# Patient Record
Sex: Male | Born: 1950
Health system: Southern US, Community
[De-identification: ages and names within clinical notes are randomized; demographics above are authoritative.]

## PROBLEM LIST (undated history)

## (undated) DIAGNOSIS — F32A Depression, unspecified: Secondary | ICD-10-CM

## (undated) DIAGNOSIS — E785 Hyperlipidemia, unspecified: Secondary | ICD-10-CM

## (undated) DIAGNOSIS — F1020 Alcohol dependence, uncomplicated: Secondary | ICD-10-CM

## (undated) DIAGNOSIS — I10 Essential (primary) hypertension: Secondary | ICD-10-CM

## (undated) DIAGNOSIS — E119 Type 2 diabetes mellitus without complications: Secondary | ICD-10-CM

## (undated) HISTORY — DX: Type 2 diabetes mellitus without complications: E11.9

## (undated) HISTORY — DX: Alcohol dependence, uncomplicated: F10.20

## (undated) HISTORY — DX: Depression, unspecified: F32.A

## (undated) HISTORY — DX: Hyperlipidemia, unspecified: E78.5

## (undated) HISTORY — PX: HERNIA REPAIR: SHX51

## (undated) HISTORY — DX: Essential (primary) hypertension: I10

## (undated) HISTORY — PX: TONSILLECTOMY: SUR1361

---

## 2010-10-22 LAB — HM COLONOSCOPY

## 2016-08-12 ENCOUNTER — Ambulatory Visit (INDEPENDENT_AMBULATORY_CARE_PROVIDER_SITE_OTHER): Payer: Self-pay | Admitting: Nurse Practitioner

## 2016-08-12 VITALS — BP 154/88 | HR 90 | Temp 98.5°F | Wt 187.2 lb

## 2016-08-12 DIAGNOSIS — K5792 Diverticulitis of intestine, part unspecified, without perforation or abscess without bleeding: Secondary | ICD-10-CM

## 2016-08-12 MED ORDER — CIPROFLOXACIN HCL 500 MG PO TABS: 500.0000 mg | ORAL_TABLET | Freq: Two times a day (BID) | ORAL | 0 refills | Status: DC

## 2016-08-12 MED ORDER — METRONIDAZOLE 500 MG PO TABS: 500.0000 mg | ORAL_TABLET | Freq: Three times a day (TID) | ORAL | 0 refills | Status: DC

## 2016-08-12 NOTE — Progress Notes (Signed)
Subjective:     Robert Walker is a 66 y.o. male who presents for evaluation of abdominal pain. Onset was today a few hours ago. Symptoms have been gradually worsening. The pain is described as sharp, and is 6/10 in intensity. Pain is located in the LLQ without radiation.  Aggravating factors: stress and diet.  Alleviating factors: antibiotics. Associated symptoms: fatigue and mild nausea with diarrhea. The patient denies anorexia, belching, constipation.  Patient has a history of diverticulitis.  Patient is visiting from out of town and forgot his Cipro and Flagyl that he normally takes.  The patient's history has been marked as reviewed and updated as appropriate. No past medical history on file. Current Outpatient Prescriptions  Medication Sig Dispense Refill  . citalopram (CELEXA) 10 MG tablet Take 10 mg by mouth daily.    Marland Kitchen lisinopril-hydrochlorothiazide (PRINZIDE,ZESTORETIC) 20-12.5 MG tablet Take 1 tablet by mouth daily.    . simvastatin (ZOCOR) 10 MG tablet Take 10 mg by mouth daily.    Marland Kitchen zolpidem (AMBIEN) 10 MG tablet Take 10 mg by mouth at bedtime as needed for sleep.     No current facility-administered medications for this visit.    Allergies: Patient has no known allergies.  Review of Systems Constitutional: positive for fatigue Eyes: negative Ears, nose, mouth, throat, and face: negative Respiratory: negative Cardiovascular: negative Gastrointestinal: positive for abdominal pain, diarrhea and nausea, negative for dysphagia and reflux symptoms Neurological: negative     Objective:    BP (!) 154/88 (BP Location: Right Arm, Patient Position: Sitting, Cuff Size: Normal)   Pulse 90   Temp 98.5 F (36.9 C) (Oral)   Wt 187 lb 3.2 oz (84.9 kg)  General appearance: alert and cooperative Head: Normocephalic, without obvious abnormality, atraumatic Eyes: conjunctivae/corneas clear. PERRL, EOM's intact. Fundi benign. Ears: normal TM's and external ear canals both ears Nose: Nares  normal. Septum midline. Mucosa normal. No drainage or sinus tenderness. Throat: lips, mucosa, and tongue normal; teeth and gums normal Lungs: clear to auscultation bilaterally Heart: regular rate and rhythm, S1, S2 normal, no murmur, click, rub or gallop Abdomen: abnormal findings:  LLQ tenderness Neurologic: Grossly normal    Assessment:    Abdominal pain, likely secondary to Diverticulitis Exacerbation .    Plan:    The diagnosis was discussed with the patient and evaluation and treatment plans outlined. Follow up with PCP in 5-7  days. Liquid diet until symptoms improve.  Take antibiotics as ordered.  Go to ER if increasing abdominal pain, fever, intractable vomiting, or other concerns.  Patient verbalized understanding.

## 2016-08-12 NOTE — Patient Instructions (Addendum)

## 2016-08-14 ENCOUNTER — Telehealth: Payer: Self-pay | Admitting: Nurse Practitioner

## 2016-08-14 NOTE — Telephone Encounter (Signed)
Called patient to check his status.  Reached voicemail, left message for patient to return the call.

## 2018-05-11 DIAGNOSIS — K5792 Diverticulitis of intestine, part unspecified, without perforation or abscess without bleeding: Secondary | ICD-10-CM | POA: Diagnosis not present

## 2018-05-11 DIAGNOSIS — R109 Unspecified abdominal pain: Secondary | ICD-10-CM | POA: Diagnosis not present

## 2018-05-25 DIAGNOSIS — Z0000 Encounter for general adult medical examination without abnormal findings: Secondary | ICD-10-CM | POA: Diagnosis not present

## 2018-09-02 ENCOUNTER — Other Ambulatory Visit: Payer: Self-pay

## 2018-11-07 DIAGNOSIS — K5792 Diverticulitis of intestine, part unspecified, without perforation or abscess without bleeding: Secondary | ICD-10-CM | POA: Diagnosis not present

## 2018-11-07 DIAGNOSIS — Z23 Encounter for immunization: Secondary | ICD-10-CM | POA: Diagnosis not present

## 2018-12-13 DIAGNOSIS — I10 Essential (primary) hypertension: Secondary | ICD-10-CM | POA: Diagnosis not present

## 2018-12-13 DIAGNOSIS — Z1329 Encounter for screening for other suspected endocrine disorder: Secondary | ICD-10-CM | POA: Diagnosis not present

## 2018-12-13 DIAGNOSIS — E1165 Type 2 diabetes mellitus with hyperglycemia: Secondary | ICD-10-CM | POA: Diagnosis not present

## 2018-12-13 DIAGNOSIS — E785 Hyperlipidemia, unspecified: Secondary | ICD-10-CM | POA: Diagnosis not present

## 2018-12-15 DIAGNOSIS — I10 Essential (primary) hypertension: Secondary | ICD-10-CM | POA: Diagnosis not present

## 2018-12-15 DIAGNOSIS — F3340 Major depressive disorder, recurrent, in remission, unspecified: Secondary | ICD-10-CM | POA: Diagnosis not present

## 2018-12-15 DIAGNOSIS — E782 Mixed hyperlipidemia: Secondary | ICD-10-CM | POA: Diagnosis not present

## 2018-12-15 DIAGNOSIS — Z8719 Personal history of other diseases of the digestive system: Secondary | ICD-10-CM | POA: Diagnosis not present

## 2018-12-15 DIAGNOSIS — R7301 Impaired fasting glucose: Secondary | ICD-10-CM | POA: Diagnosis not present

## 2018-12-15 DIAGNOSIS — R7303 Prediabetes: Secondary | ICD-10-CM | POA: Diagnosis not present

## 2019-03-02 DIAGNOSIS — D2112 Benign neoplasm of connective and other soft tissue of left upper limb, including shoulder: Secondary | ICD-10-CM | POA: Diagnosis not present

## 2019-03-02 DIAGNOSIS — L308 Other specified dermatitis: Secondary | ICD-10-CM | POA: Diagnosis not present

## 2019-03-02 DIAGNOSIS — I788 Other diseases of capillaries: Secondary | ICD-10-CM | POA: Diagnosis not present

## 2019-03-02 DIAGNOSIS — D2362 Other benign neoplasm of skin of left upper limb, including shoulder: Secondary | ICD-10-CM | POA: Diagnosis not present

## 2019-03-02 DIAGNOSIS — D485 Neoplasm of uncertain behavior of skin: Secondary | ICD-10-CM | POA: Diagnosis not present

## 2019-03-02 DIAGNOSIS — D1801 Hemangioma of skin and subcutaneous tissue: Secondary | ICD-10-CM | POA: Diagnosis not present

## 2019-08-30 ENCOUNTER — Other Ambulatory Visit: Payer: Self-pay

## 2019-11-09 DIAGNOSIS — Z8719 Personal history of other diseases of the digestive system: Secondary | ICD-10-CM | POA: Diagnosis not present

## 2019-11-09 DIAGNOSIS — I10 Essential (primary) hypertension: Secondary | ICD-10-CM | POA: Diagnosis not present

## 2019-11-09 DIAGNOSIS — E785 Hyperlipidemia, unspecified: Secondary | ICD-10-CM | POA: Diagnosis not present

## 2019-11-09 DIAGNOSIS — R7303 Prediabetes: Secondary | ICD-10-CM | POA: Diagnosis not present

## 2019-11-09 DIAGNOSIS — R7301 Impaired fasting glucose: Secondary | ICD-10-CM | POA: Diagnosis not present

## 2019-11-09 DIAGNOSIS — Z712 Person consulting for explanation of examination or test findings: Secondary | ICD-10-CM | POA: Diagnosis not present

## 2019-11-09 DIAGNOSIS — Z0000 Encounter for general adult medical examination without abnormal findings: Secondary | ICD-10-CM | POA: Diagnosis not present

## 2019-11-09 DIAGNOSIS — K5792 Diverticulitis of intestine, part unspecified, without perforation or abscess without bleeding: Secondary | ICD-10-CM | POA: Diagnosis not present

## 2019-11-09 DIAGNOSIS — F3340 Major depressive disorder, recurrent, in remission, unspecified: Secondary | ICD-10-CM | POA: Diagnosis not present

## 2019-11-09 DIAGNOSIS — R109 Unspecified abdominal pain: Secondary | ICD-10-CM | POA: Diagnosis not present

## 2019-11-09 DIAGNOSIS — E782 Mixed hyperlipidemia: Secondary | ICD-10-CM | POA: Diagnosis not present

## 2019-11-09 DIAGNOSIS — R1013 Epigastric pain: Secondary | ICD-10-CM | POA: Diagnosis not present

## 2019-11-14 DIAGNOSIS — E782 Mixed hyperlipidemia: Secondary | ICD-10-CM | POA: Diagnosis not present

## 2019-11-14 DIAGNOSIS — I10 Essential (primary) hypertension: Secondary | ICD-10-CM | POA: Diagnosis not present

## 2019-11-14 DIAGNOSIS — F3340 Major depressive disorder, recurrent, in remission, unspecified: Secondary | ICD-10-CM | POA: Diagnosis not present

## 2019-11-14 DIAGNOSIS — Z0001 Encounter for general adult medical examination with abnormal findings: Secondary | ICD-10-CM | POA: Diagnosis not present

## 2019-11-14 DIAGNOSIS — Z8719 Personal history of other diseases of the digestive system: Secondary | ICD-10-CM | POA: Diagnosis not present

## 2019-11-14 DIAGNOSIS — R7303 Prediabetes: Secondary | ICD-10-CM | POA: Diagnosis not present

## 2019-11-14 DIAGNOSIS — R7301 Impaired fasting glucose: Secondary | ICD-10-CM | POA: Diagnosis not present

## 2019-11-14 DIAGNOSIS — Z23 Encounter for immunization: Secondary | ICD-10-CM | POA: Diagnosis not present

## 2019-12-28 DIAGNOSIS — E7849 Other hyperlipidemia: Secondary | ICD-10-CM | POA: Diagnosis not present

## 2019-12-28 DIAGNOSIS — I10 Essential (primary) hypertension: Secondary | ICD-10-CM | POA: Diagnosis not present

## 2020-02-03 DIAGNOSIS — I10 Essential (primary) hypertension: Secondary | ICD-10-CM | POA: Diagnosis not present

## 2020-02-03 DIAGNOSIS — E7849 Other hyperlipidemia: Secondary | ICD-10-CM | POA: Diagnosis not present

## 2020-04-02 ENCOUNTER — Other Ambulatory Visit (HOSPITAL_COMMUNITY): Payer: Self-pay | Admitting: Family Medicine

## 2020-04-02 DIAGNOSIS — R053 Chronic cough: Secondary | ICD-10-CM

## 2020-04-02 DIAGNOSIS — J069 Acute upper respiratory infection, unspecified: Secondary | ICD-10-CM | POA: Diagnosis not present

## 2020-04-02 DIAGNOSIS — R059 Cough, unspecified: Secondary | ICD-10-CM | POA: Diagnosis not present

## 2020-04-03 ENCOUNTER — Other Ambulatory Visit: Payer: Self-pay

## 2020-04-03 ENCOUNTER — Ambulatory Visit (HOSPITAL_COMMUNITY)
Admission: RE | Admit: 2020-04-03 | Discharge: 2020-04-03 | Disposition: A | Discharge status: Home / Self Care | Payer: PPO | Source: Ambulatory Visit | Attending: Family Medicine | Admitting: Family Medicine

## 2020-04-03 DIAGNOSIS — R059 Cough, unspecified: Secondary | ICD-10-CM | POA: Diagnosis not present

## 2020-04-03 DIAGNOSIS — R053 Chronic cough: Secondary | ICD-10-CM | POA: Diagnosis not present

## 2020-05-02 DIAGNOSIS — I10 Essential (primary) hypertension: Secondary | ICD-10-CM | POA: Diagnosis not present

## 2020-05-02 DIAGNOSIS — E1165 Type 2 diabetes mellitus with hyperglycemia: Secondary | ICD-10-CM | POA: Diagnosis not present

## 2020-06-03 DIAGNOSIS — E1165 Type 2 diabetes mellitus with hyperglycemia: Secondary | ICD-10-CM | POA: Diagnosis not present

## 2020-06-03 DIAGNOSIS — I10 Essential (primary) hypertension: Secondary | ICD-10-CM | POA: Diagnosis not present

## 2020-10-03 DIAGNOSIS — I10 Essential (primary) hypertension: Secondary | ICD-10-CM | POA: Diagnosis not present

## 2020-10-03 DIAGNOSIS — E1165 Type 2 diabetes mellitus with hyperglycemia: Secondary | ICD-10-CM | POA: Diagnosis not present

## 2020-11-07 DIAGNOSIS — E119 Type 2 diabetes mellitus without complications: Secondary | ICD-10-CM | POA: Diagnosis not present

## 2020-11-07 DIAGNOSIS — I10 Essential (primary) hypertension: Secondary | ICD-10-CM | POA: Diagnosis not present

## 2020-11-14 DIAGNOSIS — E1165 Type 2 diabetes mellitus with hyperglycemia: Secondary | ICD-10-CM | POA: Diagnosis not present

## 2020-11-14 DIAGNOSIS — Z125 Encounter for screening for malignant neoplasm of prostate: Secondary | ICD-10-CM | POA: Diagnosis not present

## 2020-11-14 DIAGNOSIS — Z23 Encounter for immunization: Secondary | ICD-10-CM | POA: Diagnosis not present

## 2020-11-14 DIAGNOSIS — F3340 Major depressive disorder, recurrent, in remission, unspecified: Secondary | ICD-10-CM | POA: Diagnosis not present

## 2020-11-14 DIAGNOSIS — Z8719 Personal history of other diseases of the digestive system: Secondary | ICD-10-CM | POA: Diagnosis not present

## 2020-11-14 DIAGNOSIS — R7401 Elevation of levels of liver transaminase levels: Secondary | ICD-10-CM | POA: Diagnosis not present

## 2020-11-14 DIAGNOSIS — I10 Essential (primary) hypertension: Secondary | ICD-10-CM | POA: Diagnosis not present

## 2020-11-14 DIAGNOSIS — F1021 Alcohol dependence, in remission: Secondary | ICD-10-CM | POA: Diagnosis not present

## 2020-11-14 DIAGNOSIS — E782 Mixed hyperlipidemia: Secondary | ICD-10-CM | POA: Diagnosis not present

## 2021-02-14 DIAGNOSIS — E1165 Type 2 diabetes mellitus with hyperglycemia: Secondary | ICD-10-CM | POA: Diagnosis not present

## 2021-02-14 DIAGNOSIS — E782 Mixed hyperlipidemia: Secondary | ICD-10-CM | POA: Diagnosis not present

## 2021-02-14 DIAGNOSIS — Z125 Encounter for screening for malignant neoplasm of prostate: Secondary | ICD-10-CM | POA: Diagnosis not present

## 2021-02-22 DIAGNOSIS — K029 Dental caries, unspecified: Secondary | ICD-10-CM | POA: Diagnosis not present

## 2021-02-22 DIAGNOSIS — Z125 Encounter for screening for malignant neoplasm of prostate: Secondary | ICD-10-CM | POA: Diagnosis not present

## 2021-02-22 DIAGNOSIS — F3340 Major depressive disorder, recurrent, in remission, unspecified: Secondary | ICD-10-CM | POA: Diagnosis not present

## 2021-02-22 DIAGNOSIS — I10 Essential (primary) hypertension: Secondary | ICD-10-CM | POA: Diagnosis not present

## 2021-02-22 DIAGNOSIS — Z8719 Personal history of other diseases of the digestive system: Secondary | ICD-10-CM | POA: Diagnosis not present

## 2021-02-22 DIAGNOSIS — E782 Mixed hyperlipidemia: Secondary | ICD-10-CM | POA: Diagnosis not present

## 2021-02-22 DIAGNOSIS — E1165 Type 2 diabetes mellitus with hyperglycemia: Secondary | ICD-10-CM | POA: Diagnosis not present

## 2021-02-22 DIAGNOSIS — F1021 Alcohol dependence, in remission: Secondary | ICD-10-CM | POA: Diagnosis not present

## 2021-02-22 DIAGNOSIS — R7401 Elevation of levels of liver transaminase levels: Secondary | ICD-10-CM | POA: Diagnosis not present

## 2021-04-24 ENCOUNTER — Other Ambulatory Visit (HOSPITAL_COMMUNITY): Payer: Self-pay | Admitting: Nurse Practitioner

## 2021-04-24 DIAGNOSIS — M25541 Pain in joints of right hand: Secondary | ICD-10-CM | POA: Diagnosis not present

## 2021-04-24 DIAGNOSIS — M25542 Pain in joints of left hand: Secondary | ICD-10-CM | POA: Diagnosis not present

## 2021-04-24 DIAGNOSIS — M79642 Pain in left hand: Secondary | ICD-10-CM

## 2021-04-24 DIAGNOSIS — M25531 Pain in right wrist: Secondary | ICD-10-CM | POA: Diagnosis not present

## 2021-04-24 DIAGNOSIS — M25532 Pain in left wrist: Secondary | ICD-10-CM

## 2021-04-24 DIAGNOSIS — M79641 Pain in right hand: Secondary | ICD-10-CM

## 2021-04-25 ENCOUNTER — Other Ambulatory Visit: Payer: Self-pay

## 2021-04-25 ENCOUNTER — Ambulatory Visit (HOSPITAL_COMMUNITY)
Admission: RE | Admit: 2021-04-25 | Discharge: 2021-04-25 | Disposition: A | Discharge status: Home / Self Care | Payer: PPO | Source: Ambulatory Visit | Attending: Nurse Practitioner | Admitting: Nurse Practitioner

## 2021-04-25 DIAGNOSIS — M25532 Pain in left wrist: Secondary | ICD-10-CM | POA: Insufficient documentation

## 2021-04-25 DIAGNOSIS — M19032 Primary osteoarthritis, left wrist: Secondary | ICD-10-CM | POA: Diagnosis not present

## 2021-04-25 DIAGNOSIS — M19042 Primary osteoarthritis, left hand: Secondary | ICD-10-CM | POA: Diagnosis not present

## 2021-04-25 DIAGNOSIS — M19041 Primary osteoarthritis, right hand: Secondary | ICD-10-CM | POA: Diagnosis not present

## 2021-04-25 DIAGNOSIS — M79642 Pain in left hand: Secondary | ICD-10-CM | POA: Diagnosis not present

## 2021-04-25 DIAGNOSIS — M79641 Pain in right hand: Secondary | ICD-10-CM | POA: Insufficient documentation

## 2021-04-25 DIAGNOSIS — M25541 Pain in joints of right hand: Secondary | ICD-10-CM | POA: Diagnosis not present

## 2021-04-25 DIAGNOSIS — M19031 Primary osteoarthritis, right wrist: Secondary | ICD-10-CM | POA: Diagnosis not present

## 2021-05-01 ENCOUNTER — Other Ambulatory Visit: Payer: Self-pay | Admitting: Nurse Practitioner

## 2021-05-01 ENCOUNTER — Other Ambulatory Visit (HOSPITAL_COMMUNITY): Payer: Self-pay | Admitting: Nurse Practitioner

## 2021-05-01 DIAGNOSIS — R2231 Localized swelling, mass and lump, right upper limb: Secondary | ICD-10-CM

## 2021-05-01 DIAGNOSIS — M25531 Pain in right wrist: Secondary | ICD-10-CM | POA: Diagnosis not present

## 2021-05-01 DIAGNOSIS — M25542 Pain in joints of left hand: Secondary | ICD-10-CM | POA: Diagnosis not present

## 2021-05-01 DIAGNOSIS — M25541 Pain in joints of right hand: Secondary | ICD-10-CM | POA: Diagnosis not present

## 2021-05-06 ENCOUNTER — Ambulatory Visit: Payer: PPO | Admitting: Family Medicine

## 2021-05-07 ENCOUNTER — Ambulatory Visit (HOSPITAL_BASED_OUTPATIENT_CLINIC_OR_DEPARTMENT_OTHER)
Admission: RE | Admit: 2021-05-07 | Discharge: 2021-05-07 | Disposition: A | Discharge status: Home / Self Care | Payer: PPO | Source: Ambulatory Visit | Attending: Nurse Practitioner | Admitting: Nurse Practitioner

## 2021-05-07 DIAGNOSIS — R2231 Localized swelling, mass and lump, right upper limb: Secondary | ICD-10-CM | POA: Diagnosis not present

## 2021-05-07 DIAGNOSIS — M1811 Unilateral primary osteoarthritis of first carpometacarpal joint, right hand: Secondary | ICD-10-CM | POA: Diagnosis not present

## 2021-05-23 ENCOUNTER — Ambulatory Visit (INDEPENDENT_AMBULATORY_CARE_PROVIDER_SITE_OTHER): Payer: PPO | Admitting: Family Medicine

## 2021-05-23 ENCOUNTER — Telehealth: Payer: Self-pay | Admitting: Family Medicine

## 2021-05-23 ENCOUNTER — Encounter (INDEPENDENT_AMBULATORY_CARE_PROVIDER_SITE_OTHER): Payer: Self-pay | Admitting: *Deleted

## 2021-05-23 ENCOUNTER — Encounter: Payer: Self-pay | Admitting: Family Medicine

## 2021-05-23 VITALS — BP 134/84 | HR 81 | Ht 68.0 in | Wt 182.0 lb

## 2021-05-23 DIAGNOSIS — Z23 Encounter for immunization: Secondary | ICD-10-CM | POA: Diagnosis not present

## 2021-05-23 DIAGNOSIS — M19041 Primary osteoarthritis, right hand: Secondary | ICD-10-CM

## 2021-05-23 DIAGNOSIS — M67431 Ganglion, right wrist: Secondary | ICD-10-CM | POA: Diagnosis not present

## 2021-05-23 DIAGNOSIS — E559 Vitamin D deficiency, unspecified: Secondary | ICD-10-CM

## 2021-05-23 DIAGNOSIS — K5792 Diverticulitis of intestine, part unspecified, without perforation or abscess without bleeding: Secondary | ICD-10-CM

## 2021-05-23 DIAGNOSIS — R7301 Impaired fasting glucose: Secondary | ICD-10-CM

## 2021-05-23 DIAGNOSIS — Z1329 Encounter for screening for other suspected endocrine disorder: Secondary | ICD-10-CM | POA: Diagnosis not present

## 2021-05-23 DIAGNOSIS — Z1159 Encounter for screening for other viral diseases: Secondary | ICD-10-CM | POA: Diagnosis not present

## 2021-05-23 DIAGNOSIS — E7800 Pure hypercholesterolemia, unspecified: Secondary | ICD-10-CM | POA: Diagnosis not present

## 2021-05-23 DIAGNOSIS — Z131 Encounter for screening for diabetes mellitus: Secondary | ICD-10-CM | POA: Diagnosis not present

## 2021-05-23 NOTE — Progress Notes (Addendum)
? ?New Patient Office Visit ? ?Subjective:  ?Patient ID: Robert Walker, male    DOB: [birth year]  Age: 71 y.o. MRN: [id number] ? ?CC:  ?Chief Complaint  ?Patient presents with  ? New Patient (Initial Visit)  ?  Pt complains of arthritis on right hand, and has some discomfort on left hand also.  ? ? ?HPI ?RICKARD KENNERLY is a 71 y.o. male with a PMH of diverticulitis presents for establishing care. The patient is seen today alongside his wife. 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 Recently, the pain in his right hand has intensified. The patient reports using OTC Voltaren gel with minimum relief. He also noted a cyst at the base of the thumb that hurt internally and not with palpation.  ?The patient has had diverticulitis for 32 years and reports that OTC medication like Motrin and Tylenol tablets triggers his diverticulitis. He can only tolerate Motrin and Tylenol if given in liquid form. ? ?Received first Shingrix vaccine today. ? ?History reviewed. No pertinent past medical history. ? ?History reviewed. No pertinent surgical history. ? ?History reviewed. No pertinent family history. ? ?Social History  ? ?Socioeconomic History  ? Marital status: Married  ?  Spouse name: Not on file  ? Number of children: Not on file  ? Years of education: Not on file  ? Highest education level: Not on file  ?Occupational History  ? Not on file  ?Tobacco Use  ? Smoking status: Never  ?  Passive exposure: Never  ? Smokeless tobacco: Never  ?Substance and Sexual Activity  ? Alcohol use: Not on file  ? Drug use: Not on file  ? Sexual activity: Not on file  ?Other Topics Concern  ? Not on file  ?Social History Narrative  ? Not on file  ? ?Social Determinants of Health  ? ?Financial Resource Strain: Not on file  ?Food Insecurity: Not on file  ?Transportation Needs: Not on file  ?Physical Activity: Not on file  ?Stress: Not on  file  ?Social Connections: Not on file  ?Intimate Partner Violence: Not on file  ? ? ?ROS ?Review of Systems  ?Constitutional:  Negative for chills, fatigue and fever.  ?HENT:  Negative for rhinorrhea, sinus pressure, sinus pain and sore throat.   ?Eyes:  Negative for pain, redness and itching.  ?Respiratory:  Negative for cough, chest tightness and shortness of breath.   ?Cardiovascular:  Negative for chest pain and palpitations.  ?Gastrointestinal:  Negative for constipation, diarrhea, nausea and vomiting.  ?Endocrine: Negative for polydipsia, polyphagia and polyuria.  ?Genitourinary:  Negative for difficulty urinating, frequency, hematuria and urgency.  ?Musculoskeletal:  Positive for arthralgias. Negative for back pain, neck pain and neck stiffness.  ?Skin:  Negative for color change and rash.  ?Allergic/Immunologic: Negative for food allergies.  ?Neurological:  Negative for dizziness, weakness and headaches.  ?Hematological:  Does not bruise/bleed easily.  ?Psychiatric/Behavioral:  Negative for confusion, self-injury and suicidal ideas.   ? ?Objective:  ? ?Today's Vitals: BP 134/84 (BP Location: Left Arm, Patient Position: Sitting)   Pulse 81   Ht 5' 8" (1.727 m)   Wt 182 lb (82.6 kg)   SpO2 96%   BMI 27.67 kg/m?  ? ?Physical Exam ?Constitutional:   ?   Appearance: Normal appearance.  ?HENT:  ?   Head: Normocephalic.  ?   Right Ear: External ear normal.  ?  Left Ear: External ear normal.  ?   Nose: Nose normal. No congestion or rhinorrhea.  ?   Mouth/Throat:  ?   Mouth: Mucous membranes are moist.  ?   Pharynx: No oropharyngeal exudate or posterior oropharyngeal erythema.  ?Eyes:  ?   General:     ?   Right eye: No discharge.     ?   Left eye: No discharge.  ?   Extraocular Movements: Extraocular movements intact.  ?   Pupils: Pupils are equal, round, and reactive to light.  ?Cardiovascular:  ?   Rate and Rhythm: Normal rate and regular rhythm.  ?   Pulses: Normal pulses.  ?   Heart sounds: Normal heart  sounds.  ?Pulmonary:  ?   Effort: Pulmonary effort is normal.  ?   Breath sounds: Normal breath sounds.  ?Abdominal:  ?   Palpations: Abdomen is soft.  ?   Tenderness: There is no right CVA tenderness or left CVA tenderness.  ?Genitourinary: ?   Comments: deferred ?Musculoskeletal:     ?   General: Normal range of motion.  ?   Cervical back: Normal range of motion and neck supple.  ?   Comments: Ganglion cyst of the volar aspect of the rt wrist ( radial zone)  ?Skin: ?   General: Skin is warm.  ?   Findings: No lesion or rash.  ?Neurological:  ?   Mental Status: He is alert and oriented to person, place, and time.  ?   Motor: No weakness.  ?   Gait: Gait normal.  ?Psychiatric:  ?   Comments: Normal affect  ? ? ?Assessment & Plan:  ? ?Problem List Items Addressed This Visit   ? ?  ? Musculoskeletal and Integument  ? Arthritis of hand, right  ?  -Advised to apply OTC topical Capsaicin cream for relief of symptoms ? ?  ?  ?  ? Other  ? Ganglion cyst of volar aspect of right wrist  ?  Patient denies pain with palpation but noted internal nerve pain ?Referral to orthocare in [place] ? ?  ?  ? Relevant Orders  ? Ambulatory referral to Orthopedic Surgery  ? Diverticulitis - Primary  ?  -No complaints or concerns today ?-Taking Culturelle probiotic for preventative therapy ?-Takes ciprofloxacin (Cipro) and Metronidazole (Flagyl) during acute phases ?-adherent with high fiber diest and avoiding nuts, seeds, and popcorn ? ?  ?  ? Relevant Orders  ? CBC with Differential/Platelet  ? CMP14+EGFR  ? Lipid panel  ? TSH + free T4  ? Ambulatory referral to Gastroenterology  ? ?Other Visit Diagnoses   ? ? Encounter for hepatitis C screening test for low risk patient      ? Relevant Orders  ? Hepatitis C Antibody  ? Vitamin D deficiency      ? Relevant Orders  ? Vitamin D (25 hydroxy)  ? IFG (impaired fasting glucose)      ? Relevant Orders  ? Hemoglobin A1c  ? Immunization due      ? Relevant Orders  ? Varicella-zoster vaccine IM  (Completed)  ? ?  ? ? ?Outpatient Encounter Medications as of 05/23/2021  ?Medication Sig  ? ciprofloxacin (CIPRO) 750 MG tablet Take 750 mg by mouth 2 (two) times daily. As needed  ? citalopram (CELEXA) 10 MG tablet Take 10 mg by mouth daily.  ? Flaxseed, Linseed, (FLAXSEED OIL) 1000 MG CAPS Take by mouth.  ? Lactobacillus-Inulin (CULTURELLE ADULT ULT BALANCE) CAPS Take  by mouth in the morning and at bedtime.  ? lisinopril-hydrochlorothiazide (PRINZIDE,ZESTORETIC) 20-12.5 MG tablet Take 1 tablet by mouth daily.  ? metronidazole (FLAGYL) 375 MG capsule Take 375 mg by mouth 2 (two) times daily. As needed  ? Misc Natural Products (PROSTATE SUPPORT PO) Take by mouth.  ? Multiple Vitamins-Minerals (PRESERVISION AREDS 2 PO) Take by mouth.  ? simvastatin (ZOCOR) 40 MG tablet Take 40 mg by mouth daily.  ? simvastatin (ZOCOR) 10 MG tablet Take 10 mg by mouth daily. (Patient not taking: Reported on 05/23/2021)  ? zolpidem (AMBIEN) 10 MG tablet Take 10 mg by mouth at bedtime as needed for sleep.  ? ?No facility-administered encounter medications on file as of 05/23/2021.  ? ? ?Follow-up: Return in about 6 months (around 11/22/2021) for f/u.  ? ?Alvira Monday, FNP ?

## 2021-05-23 NOTE — Assessment & Plan Note (Addendum)
-  No complaints or concerns today ?-Taking Culturelle probiotic for preventative therapy ?-Takes ciprofloxacin (Cipro) and Metronidazole (Flagyl) during acute phases ?-adherent with high fiber diest and avoiding nuts, seeds, and popcorn ? ?

## 2021-05-23 NOTE — Assessment & Plan Note (Addendum)
Patient denies pain with palpation but noted internal nerve pain ?Referral to orthocare in [place] ?

## 2021-05-23 NOTE — Assessment & Plan Note (Signed)
-  Advised to apply OTC topical Capsaicin cream for relief of symptoms ?

## 2021-05-23 NOTE — Patient Instructions (Addendum)
I appreciate the opportunity to provide care to you today! ?  ?- please pick up OTC topical capsaicin cream to help with arthritis of the right hands ? ?-Please stop by the lab today to get your blood drawn (cbc, cmp, TSH, lipid profil, Vit D, HgA1c ? ?-Screening: hepatitis C ? ?-You received your first Shingrix vaccine today and will receive the second vaccine at your next appointment. ? ?-Referrals today-  GI with Dr. Laural Golden ? ?-Please continue to healthy eating habits we will see you in 6 months ? ? ?  ?It was a pleasure to see you and I look forward to continuing to work together on your health and well-being. ?Please do not hesitate to call the office if you need care or have questions about your care. ?  ?Have a wonderful day and week. ?With Gratitude, ?Alvira Monday MSN, FNP-BC ? ?

## 2021-05-24 ENCOUNTER — Encounter: Payer: Self-pay | Admitting: Family Medicine

## 2021-05-24 ENCOUNTER — Telehealth: Payer: PPO | Admitting: Family Medicine

## 2021-05-24 ENCOUNTER — Ambulatory Visit (INDEPENDENT_AMBULATORY_CARE_PROVIDER_SITE_OTHER): Payer: PPO | Admitting: Family Medicine

## 2021-05-24 ENCOUNTER — Telehealth: Payer: Self-pay | Admitting: Family Medicine

## 2021-05-24 DIAGNOSIS — E119 Type 2 diabetes mellitus without complications: Secondary | ICD-10-CM

## 2021-05-24 LAB — LIPID PANEL
Chol/HDL Ratio: 3.3 ratio (ref 0.0–5.0)
Cholesterol, Total: 153 mg/dL (ref 100–199)
HDL: 46 mg/dL (ref >39)
LDL Chol Calc (NIH): 82 mg/dL (ref 0–99)
Triglycerides: 140 mg/dL (ref 0–149)
VLDL Cholesterol Cal: 25 mg/dL (ref 5–40)

## 2021-05-24 LAB — CMP14+EGFR
ALT: 29 IU/L (ref 0–44)
AST: 37 IU/L (ref 0–40)
Albumin/Globulin Ratio: 1.9 (ref 1.2–2.2)
Albumin: 4.8 g/dL — ABNORMAL HIGH (ref 3.7–4.7)
Alkaline Phosphatase: 47 IU/L (ref 44–121)
BUN/Creatinine Ratio: 15 (ref 10–24)
BUN: 13 mg/dL (ref 8–27)
Bilirubin Total: 0.4 mg/dL (ref 0.0–1.2)
CO2: 26 mmol/L (ref 20–29)
Calcium: 9.9 mg/dL (ref 8.6–10.2)
Chloride: 98 mmol/L (ref 96–106)
Creatinine, Ser: 0.88 mg/dL (ref 0.76–1.27)
Globulin, Total: 2.5 g/dL (ref 1.5–4.5)
Glucose: 115 mg/dL — ABNORMAL HIGH (ref 70–99)
Potassium: 4.8 mmol/L (ref 3.5–5.2)
Sodium: 138 mmol/L (ref 134–144)
Total Protein: 7.3 g/dL (ref 6.0–8.5)
eGFR: 92 mL/min/{1.73_m2} (ref >59)

## 2021-05-24 LAB — HEPATITIS C ANTIBODY: Hep C Virus Ab: NONREACTIVE

## 2021-05-24 LAB — CBC WITH DIFFERENTIAL/PLATELET
Basophils Absolute: 0 10*3/uL (ref 0.0–0.2)
Basos: 1 %
EOS (ABSOLUTE): 0.1 10*3/uL (ref 0.0–0.4)
Eos: 3 %
Hematocrit: 45.7 % (ref 37.5–51.0)
Hemoglobin: 15.5 g/dL (ref 13.0–17.7)
Immature Grans (Abs): 0 10*3/uL (ref 0.0–0.1)
Immature Granulocytes: 0 %
Lymphocytes Absolute: 1.3 10*3/uL (ref 0.7–3.1)
Lymphs: 27 %
MCH: 31.5 pg (ref 26.6–33.0)
MCHC: 33.9 g/dL (ref 31.5–35.7)
MCV: 93 fL (ref 79–97)
Monocytes Absolute: 0.6 10*3/uL (ref 0.1–0.9)
Monocytes: 12 %
Neutrophils Absolute: 2.8 10*3/uL (ref 1.4–7.0)
Neutrophils: 57 %
Platelets: 302 10*3/uL (ref 150–450)
RBC: 4.92 x10E6/uL (ref 4.14–5.80)
RDW: 12.2 % (ref 11.6–15.4)
WBC: 4.9 10*3/uL (ref 3.4–10.8)

## 2021-05-24 LAB — TSH+FREE T4
Free T4: 1.03 ng/dL (ref 0.82–1.77)
TSH: 1.18 u[IU]/mL (ref 0.450–4.500)

## 2021-05-24 LAB — VITAMIN D 25 HYDROXY (VIT D DEFICIENCY, FRACTURES): Vit D, 25-Hydroxy: 34.6 ng/mL (ref 30.0–100.0)

## 2021-05-24 LAB — HEMOGLOBIN A1C
Est. average glucose Bld gHb Est-mCnc: 154 mg/dL
Hgb A1c MFr Bld: 7 % — ABNORMAL HIGH (ref 4.8–5.6)

## 2021-05-24 NOTE — Telephone Encounter (Signed)
Vm was left for pt.  ?

## 2021-05-24 NOTE — Telephone Encounter (Signed)
Return call

## 2021-05-24 NOTE — Progress Notes (Deleted)
Virtual Visit via Telephone Note   This visit type was conducted due to national recommendations for restrictions regarding the COVID-19 Pandemic (e.g. social distancing) in an effort to limit this patient's exposure and mitigate transmission in our community.  Due to his co-morbid illnesses, this patient is at least at moderate risk for complications without adequate follow up.  This format is felt to be most appropriate for this patient at this time.  The patient did not have access to video technology/had technical difficulties with video requiring transitioning to audio format only (telephone).  All issues noted in this document were discussed and addressed.  No physical exam could be performed with this format.  Please refer to the patient's chart for his  consent to telehealth for [facility].   Evaluation Performed:  Follow-up visit  Date:  05/24/2021   ID:  Robert, Walker [birth year], MRN [id number]  Patient Location: Home Provider Location: Office/Clinic  Participants: Patient*** Location of Patient: Home Location of Provider: Telehealth Consent was obtain for visit to be over via telehealth. I verified that I am speaking with the correct person using two identifiers.  PCP:  Alvira Monday, FNP   Chief Complaint:  ***  History of Present Illness:    Robert Walker is a 71 y.o. male with ***  The patient {does/does not:200015} have symptoms concerning for COVID-19 infection (fever, chills, cough, or new shortness of breath).   Past Medical, Surgical, Social History, Allergies, and Medications have been Reviewed.  No past medical history on file. No past surgical history on file.   No outpatient medications have been marked as taking for the 05/24/21 encounter (Appointment) with Alvira Monday, [place].     Allergies:   Bee venom   ROS:   Please see the history of present illness.    *** All other systems reviewed and are negative.   Labs/Other Tests and Data  Reviewed:    Recent Labs: 05/23/2021: ALT 29; BUN 13; Creatinine, Ser 0.88; Hemoglobin 15.5; Platelets 302; Potassium 4.8; Sodium 138; TSH 1.180   Recent Lipid Panel Lab Results  Component Value Date/Time   CHOL 153 05/23/2021 09:18 AM   TRIG 140 05/23/2021 09:18 AM   HDL 46 05/23/2021 09:18 AM   CHOLHDL 3.3 05/23/2021 09:18 AM   LDLCALC 82 05/23/2021 09:18 AM    Wt Readings from Last 3 Encounters:  05/23/21 182 lb (82.6 kg)  08/12/16 187 lb 3.2 oz (84.9 kg)     Objective:    Vital Signs:  There were no vitals taken for this visit.   { Primary Care Virtual Exam (Optional):[phone]::"VITAL SIGNS:  reviewed"}  ASSESSMENT & PLAN:     Time:   Today, I have spent *** minutes reviewing the chart, including problem list, medications, and with the patient with telehealth technology discussing the above problems.   Medication Adjustments/Labs and Tests Ordered: Current medicines are reviewed at length with the patient today.  Concerns regarding medicines are outlined above.   Tests Ordered: No orders of the defined types were placed in this encounter.   Medication Changes: No orders of the defined types were placed in this encounter.    Note: This dictation was prepared with Dragon dictation along with smaller phrase technology. Similar sounding words can be transcribed inadequately or may not be corrected upon review. Any transcriptional errors that result from this process are unintentional.      Disposition:  Follow up  Signed, Alvira Monday, FNP  05/24/2021  2:08 PM     [facility]

## 2021-05-24 NOTE — Progress Notes (Signed)
?  ? ?Virtual Visit via Telephone Note  ? ?This visit type was conducted due to national recommendations for restrictions regarding the COVID-19 Pandemic (e.g. social distancing) in an effort to limit this patient's exposure and mitigate transmission in our community.  Due to his co-morbid illnesses, this patient is at least at moderate risk for complications without adequate follow up.  This format is felt to be most appropriate for this patient at this time.  The patient did not have access to video technology/had technical difficulties with video requiring transitioning to audio format only (telephone).  All issues noted in this document were discussed and addressed.  No physical exam could be performed with this format.  Please refer to the patient's chart for his  consent to telehealth for [facility]  ? ?Evaluation Performed:  Follow-up visit ? ?Date:  05/24/2021  ? ?ID:  Robert Walker, DOB 1951/01/20, MRN [id number] ? ?Patient Location: Home ?Provider Location: Office/Clinic ? ?Participants: Patient ?Location of Patient: Home ?Location of Provider: Telehealth ?Consent was obtain for visit to be over via telehealth. ?I verified that I am speaking with the correct person using two identifiers. ? ?PCP:  Alvira Monday, FNP  ? ?Chief Complaint:  lab results ? ?History of Present Illness:   ? ?Robert Walker is a 71 y.o. Male spoke with him regarding his labs. The patient's hA1c1c is 7.0, indicating that he has diabetes. The patient admits to having similar results three years ago and implementing lifestyle changes. He states that he was on a strict keto diet, bringing his hA1c levels to 5.0. He reports that he has been lax with the diet for the past year but will become adherent.  ? ?The patient does not have symptoms concerning for COVID-19 infection (fever, chills, cough, or new shortness of breath).  ? ? ?Past Medical, Surgical, Social History, Allergies, and Medications have been Reviewed. ? ?History  reviewed. No pertinent past medical history. ?History reviewed. No pertinent surgical history.  ? ?Current Meds  ?Medication Sig  ? ciprofloxacin (CIPRO) 750 MG tablet Take 750 mg by mouth 2 (two) times daily. As needed  ? citalopram (CELEXA) 10 MG tablet Take 10 mg by mouth daily.  ? Flaxseed, Linseed, (FLAXSEED OIL) 1000 MG CAPS Take by mouth.  ? Lactobacillus-Inulin (CULTURELLE ADULT ULT BALANCE) CAPS Take by mouth in the morning and at bedtime.  ? lisinopril-hydrochlorothiazide (PRINZIDE,ZESTORETIC) 20-12.5 MG tablet Take 1 tablet by mouth daily.  ? metronidazole (FLAGYL) 375 MG capsule Take 375 mg by mouth 2 (two) times daily. As needed  ? Misc Natural Products (PROSTATE SUPPORT PO) Take by mouth.  ? Multiple Vitamins-Minerals (PRESERVISION AREDS 2 PO) Take by mouth.  ? simvastatin (ZOCOR) 10 MG tablet Take 10 mg by mouth daily.  ? simvastatin (ZOCOR) 40 MG tablet Take 40 mg by mouth daily.  ?  ? ?Allergies:   Bee venom  ? ?ROS:   ?Please see the history of present illness.    ? ?All other systems reviewed and are negative. ? ? ?Labs/Other Tests and Data Reviewed:   ? ?Recent Labs: ?05/23/2021: ALT 29; BUN 13; Creatinine, Ser 0.88; Hemoglobin 15.5; Platelets 302; Potassium 4.8; Sodium 138; TSH 1.180  ? ?Recent Lipid Panel ?Lab Results  ?Component Value Date/Time  ? CHOL 153 05/23/2021 09:18 AM  ? TRIG 140 05/23/2021 09:18 AM  ? HDL 46 05/23/2021 09:18 AM  ? CHOLHDL 3.3 05/23/2021 09:18 AM  ? LDLCALC 82 05/23/2021 09:18 AM  ? ? ?Wt Readings from  Last 3 Encounters:  ?05/23/21 182 lb (82.6 kg)  ?08/12/16 187 lb 3.2 oz (84.9 kg)  ?  ? ?Objective:   ? ?Vital Signs:  There were no vitals taken for this visit.  ? ? ? ?ASSESSMENT & PLAN:   ?Type 2 DM ?-pharmacological therapy deferred ?-patient will implement lifestyle changes ?-will reassess hA1c in 3 months( July) ? ? ?Time:   ?Today, I have spent 10 minutes reviewing the chart, including problem list, medications, and with the patient with telehealth technology  discussing the above problems. ? ? ?Medication Adjustments/Labs and Tests Ordered: ?Current medicines are reviewed at length with the patient today.  Concerns regarding medicines are outlined above.  ? ?Tests Ordered: ?No orders of the defined types were placed in this encounter. ? ? ?Medication Changes: ?No orders of the defined types were placed in this encounter. ? ? ? ?  ?  ? ? ?Disposition:  Follow up  ?Signed, ?Alvira Monday, FNP  ?05/24/2021 2:15 PM    ? ?[facility] ?[facility] ?

## 2021-05-31 ENCOUNTER — Ambulatory Visit: Payer: PPO | Admitting: Orthopedic Surgery

## 2021-05-31 DIAGNOSIS — M1811 Unilateral primary osteoarthritis of first carpometacarpal joint, right hand: Secondary | ICD-10-CM | POA: Diagnosis not present

## 2021-05-31 DIAGNOSIS — M67431 Ganglion, right wrist: Secondary | ICD-10-CM | POA: Diagnosis not present

## 2021-05-31 MED ORDER — LIDOCAINE HCL 1 % IJ SOLN
0.5000 mL | INTRAMUSCULAR | Status: AC | PRN
  Administered 2021-05-31: .5 mL

## 2021-05-31 MED ORDER — BETAMETHASONE SOD PHOS & ACET 6 (3-3) MG/ML IJ SUSP
3.0000 mg | INTRAMUSCULAR | Status: AC | PRN
  Administered 2021-05-31: 3 mg via INTRA_ARTICULAR

## 2021-05-31 NOTE — Progress Notes (Signed)
? ?Office Visit Note ?  ?Patient: Robert Walker           ?Date of Birth: 01/11/1951           ?MRN: [id number] ?Visit Date: 05/31/2021 ?             ?Requested by: Alvira Monday, FNP ?[address] ?[place],  [place] [address] ?PCP: Alvira Monday, FNP ? ? ?Assessment & Plan: ?Visit Diagnoses:  ?1. Ganglion cyst of volar aspect of right wrist   ?2. Arthritis of carpometacarpal [facility]) joint of right thumb   ? ? 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  He.  He had a CT scan of his right wrist which demonstrated the same findings.  We discussed the nature of [place] arthritis as well as its diagnosis, prognosis, and both surgical and conservative treatment options.  He has tried bracing, oral anti-inflammatories, and topical anti-inflammatories.  He would like to proceed with a corticosteroid injection today.  We reviewed the risks and benefits of steroid injection.  This seems to be the most symptomatic issue for him more so than the ganglion cyst.  I can see him back in 6 to 8 weeks if he still symptomatic following this injection. ? ?Follow-Up Instructions: No follow-ups on file.  ? ?Orders:  ?No orders of the defined types were placed in this encounter. ? ?No orders of the defined types were placed in this encounter. ? ? ? ? Procedures: ?Hand/UE Inj: R thumb CMC for osteoarthritis on 05/31/2021 12:37 PM ?Indications: therapeutic and pain ?Details: 25 G needle, fluoroscopy-guided radial approach ?Medications: 0.5 mL lidocaine 1 %; 3 mg betamethasone acetate-betamethasone sodium phosphate 6 (3-3) MG/ML ?Procedure, treatment alternatives, risks and benefits explained, specific risks discussed. Consent was given by the patient. Immediately prior to procedure a time out was called to verify the correct patient, procedure, equipment, support staff and site/side marked as required. Patient was prepped and draped in the usual  sterile fashion.  ? ? ? ? ?Clinical Data: ?No additional findings. ? ? ?Subjective: ?Chief Complaint  ?Patient presents with  ? Right Wrist - Ganglion Cyst  ? ? ?This is a 71 year old right-hand-dominant male who presents with pain along the right thumb CMC joint with an associated cyst.  This been going on for a month or 2.  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  It is particular worse with activities involve grasp or pinch.  He tried several treatment options so far including bracing, oral anti-inflammatories, and topical anti-inflammatories.  He has some difficulty with oral anti-inflammatories as he thinks this flares up his diverticulitis.  The pain is localized to the [facility] joint.  He does have a cyst at the volar radial aspect of the wrist but this is not as symptomatic as the base of his thumb. ? ? ?Review of Systems ? ? ?Objective: ?Vital Signs: There were no vitals taken for this visit. ? ?Physical Exam ?Constitutional:   ?   Appearance: Normal appearance.  ?Cardiovascular:  ?   Rate and Rhythm: Normal rate.  ?   Pulses: Normal pulses.  ?Pulmonary:  ?   Effort: Pulmonary effort is normal.  ?Skin: ?   General: Skin is warm and dry.  ?   Capillary Refill: Capillary refill takes less than 2 seconds.  ?Neurological:  ?  Mental Status: He is alert.  ? ? ?Right Hand Exam  ? ?Tenderness  ?Right hand tenderness location: TTP at base of thumb at [facility] and STT joints.  Mild to moderate swelling. ? ?Other  ?Erythema: absent ?Sensation: normal ?Pulse: present ? ?Comments:  Pain and crepitus with CMC grind test.  No static or dynamic MP hyper-extension.  No palmar abduction contracture.  Small well circumscribed mass at volar radial wrist.  ? ? ? ? ?Specialty Comments:  ?No specialty comments available. ? ?Imaging: ?No results found. ? ? ?PMFS History: ?Patient Active Problem List  ? Diagnosis Date Noted  ? Arthritis of  carpometacarpal [facility]) joint of right thumb 05/31/2021  ? Ganglion cyst of volar aspect of right wrist 05/23/2021  ? Diverticulitis 05/23/2021  ? Arthritis of hand, right 05/23/2021  ? ?No past medical history on file.  ?No family history on file.  ?No past surgical history on file. ?Social History  ? ?Occupational History  ? Not on file  ?Tobacco Use  ? Smoking status: Never  ?  Passive exposure: Never  ? Smokeless tobacco: Never  ?Substance and Sexual Activity  ? Alcohol use: Not on file  ? Drug use: Not on file  ? Sexual activity: Not on file  ? ? ? ? ? ? ?

## 2021-06-10 ENCOUNTER — Other Ambulatory Visit: Payer: Self-pay

## 2021-06-10 ENCOUNTER — Other Ambulatory Visit: Payer: Self-pay | Admitting: Family Medicine

## 2021-06-10 MED ORDER — SIMVASTATIN 40 MG PO TABS
40.0000 mg | ORAL_TABLET | Freq: Every day | ORAL | 1 refills | Status: DC
  Filled 2021-06-10: qty 30, 30d supply, fill #0

## 2021-06-14 ENCOUNTER — Other Ambulatory Visit: Payer: Self-pay | Admitting: *Deleted

## 2021-06-14 MED ORDER — SIMVASTATIN 40 MG PO TABS: 40.0000 mg | ORAL_TABLET | Freq: Every day | ORAL | 1 refills | Status: DC

## 2021-06-24 ENCOUNTER — Telehealth: Payer: Self-pay | Admitting: Family Medicine

## 2021-06-24 ENCOUNTER — Other Ambulatory Visit: Payer: Self-pay

## 2021-06-24 ENCOUNTER — Ambulatory Visit (INDEPENDENT_AMBULATORY_CARE_PROVIDER_SITE_OTHER): Payer: PPO | Admitting: Gastroenterology

## 2021-06-24 DIAGNOSIS — E7800 Pure hypercholesterolemia, unspecified: Secondary | ICD-10-CM

## 2021-06-24 MED ORDER — SIMVASTATIN 40 MG PO TABS: 40.0000 mg | ORAL_TABLET | Freq: Every day | ORAL | 1 refills | Status: DC

## 2021-06-24 NOTE — Telephone Encounter (Signed)
90 day supply sent to pharmacy

## 2021-06-24 NOTE — Telephone Encounter (Signed)
Patient called in regard to simvastatin (ZOCOR) 40 MG tablet  Patient wants future refills to go to [facility] In [place] [address] With 90 day supplies

## 2021-06-24 NOTE — Progress Notes (Signed)
90 day supply requested

## 2021-06-29 ENCOUNTER — Other Ambulatory Visit: Payer: Self-pay | Admitting: Family Medicine

## 2021-07-04 ENCOUNTER — Other Ambulatory Visit: Payer: Self-pay

## 2021-07-04 ENCOUNTER — Telehealth: Payer: Self-pay | Admitting: Family Medicine

## 2021-07-04 DIAGNOSIS — F32A Depression, unspecified: Secondary | ICD-10-CM

## 2021-07-04 MED ORDER — CITALOPRAM HYDROBROMIDE 10 MG PO TABS: 10.0000 mg | ORAL_TABLET | Freq: Every day | ORAL | 1 refills | Status: DC

## 2021-07-04 NOTE — Telephone Encounter (Signed)
Pt called stating he has been trying to refill on Citalopram '20mg'$ . States he sent a message on MyChart for refill with no response last week. Can you please refill Citalopram '20mg'$  & change his phar to [employer]?    [place]

## 2021-07-04 NOTE — Progress Notes (Signed)
Refill

## 2021-07-04 NOTE — Telephone Encounter (Signed)
Pt informed

## 2021-07-04 NOTE — Telephone Encounter (Signed)
There is no patient message that has came in for this medication, he did request a 90 day supply last week of a different medication that was sent in. Rx refill for Citalopram has been sent to the pharmacy.

## 2021-07-08 ENCOUNTER — Telehealth: Payer: Self-pay

## 2021-07-08 ENCOUNTER — Other Ambulatory Visit: Payer: Self-pay

## 2021-07-08 DIAGNOSIS — F32A Depression, unspecified: Secondary | ICD-10-CM

## 2021-07-08 DIAGNOSIS — E7800 Pure hypercholesterolemia, unspecified: Secondary | ICD-10-CM

## 2021-07-08 MED ORDER — SIMVASTATIN 40 MG PO TABS: 40.0000 mg | ORAL_TABLET | Freq: Every day | ORAL | 3 refills | Status: DC

## 2021-07-08 MED ORDER — LISINOPRIL-HYDROCHLOROTHIAZIDE 20-12.5 MG PO TABS: 1.0000 | ORAL_TABLET | Freq: Every day | ORAL | 3 refills | Status: DC

## 2021-07-08 MED ORDER — CITALOPRAM HYDROBROMIDE 20 MG PO TABS: 20.0000 mg | ORAL_TABLET | Freq: Every day | ORAL | 3 refills | Status: DC

## 2021-07-08 NOTE — Telephone Encounter (Signed)
Pt came in requesting a 90 day supply with 3 refills on his medications as this works better with his insurance, is needing Simvastatin 40 mg, Citalopram '20mg'$  and Lisinopril-Hydrochlo refilled.

## 2021-07-08 NOTE — Telephone Encounter (Signed)
Medication updated in chart 90 day supply sent with 3 refills.

## 2021-07-08 NOTE — Telephone Encounter (Signed)
Patient came in to speak to Nurse about his medication his medicines has been mixed up. Needs to speak to a nurse this can not continue going on.

## 2021-07-18 NOTE — Telephone Encounter (Signed)
You can refill his prescription

## 2021-07-29 ENCOUNTER — Ambulatory Visit: Payer: PPO | Admitting: Orthopedic Surgery

## 2021-07-29 ENCOUNTER — Ambulatory Visit (INDEPENDENT_AMBULATORY_CARE_PROVIDER_SITE_OTHER): Payer: PPO

## 2021-07-29 DIAGNOSIS — M1811 Unilateral primary osteoarthritis of first carpometacarpal joint, right hand: Secondary | ICD-10-CM

## 2021-07-29 DIAGNOSIS — Z0000 Encounter for general adult medical examination without abnormal findings: Secondary | ICD-10-CM

## 2021-07-29 NOTE — Progress Notes (Signed)
Subjective:   Robert Walker is a 71 y.o. male who presents for an Initial Medicare Annual Wellness Visit. I connected with  Robert Walker on 07/29/21 by a audio enabled telemedicine application and verified that I am speaking with the correct person using two identifiers.  Patient Location: Home  Provider Location: Office/Clinic  I discussed the limitations of evaluation and management by telemedicine. The patient expressed understanding and agreed to proceed.  Review of Systems           Objective:    There were no vitals filed for this visit. There is no height or weight on file to calculate BMI.      No data to display          Current Medications (verified) Outpatient Encounter Medications as of 07/29/2021  Medication Sig   ciprofloxacin (CIPRO) 750 MG tablet Take 750 mg by mouth 2 (two) times daily. As needed   citalopram (CELEXA) 20 MG tablet Take 1 tablet (20 mg total) by mouth daily.   Flaxseed, Linseed, (FLAXSEED OIL) 1000 MG CAPS Take by mouth.   Lactobacillus-Inulin (CULTURELLE ADULT ULT BALANCE) CAPS Take by mouth in the morning and at bedtime.   lisinopril-hydrochlorothiazide (ZESTORETIC) 20-12.5 MG tablet Take 1 tablet by mouth daily.   metronidazole (FLAGYL) 375 MG capsule Take 375 mg by mouth 2 (two) times daily. As needed   Misc Natural Products (PROSTATE SUPPORT PO) Take by mouth.   Multiple Vitamins-Minerals (PRESERVISION AREDS 2 PO) Take by mouth.   simvastatin (ZOCOR) 40 MG tablet Take 1 tablet (40 mg total) by mouth daily.   simvastatin (ZOCOR) 40 MG tablet Take 1 tablet (40 mg total) by mouth at bedtime.   zolpidem (AMBIEN) 10 MG tablet Take 10 mg by mouth at bedtime as needed for sleep.   No facility-administered encounter medications on file as of 07/29/2021.    Allergies (verified) Bee venom   History: No past medical history on file. No past surgical history on file. No family history on file. Social History   Socioeconomic History    Marital status: Married    Spouse name: Not on file   Number of children: Not on file   Years of education: Not on file   Highest education level: Not on file  Occupational History   Not on file  Tobacco Use   Smoking status: Never    Passive exposure: Never   Smokeless tobacco: Never  Substance and Sexual Activity   Alcohol use: Not on file   Drug use: Not on file   Sexual activity: Not on file  Other Topics Concern   Not on file  Social History Narrative   Not on file   Social Determinants of Health   Financial Resource Strain: Not on file  Food Insecurity: Not on file  Transportation Needs: Not on file  Physical Activity: Not on file  Stress: Not on file  Social Connections: Not on file    Tobacco Counseling Counseling given: Not Answered   Clinical Intake:                 Diabetic? no         Activities of Daily Living     No data to display          Patient Care Team: Gilmore Laroche, FNP as PCP - General (Family Medicine)  Indicate any recent Medical Services you may have received from other than [facility] providers in the past year (date may be  approximate).     Assessment:   This is a routine wellness examination for Robert Walker.  Hearing/Vision screen No results found.  Dietary issues and exercise activities discussed:     Goals Addressed   None    Depression Screen    05/24/2021    2:13 PM 05/23/2021    8:14 AM  PHQ 2/9 Scores  PHQ - 2 Score 0 0    Fall Risk    05/24/2021    2:13 PM 05/23/2021    8:13 AM 08/30/2019    3:54 PM 09/02/2018    3:29 PM  Fall Risk   Falls in the past year? 0 0 0 0  Comment   [facility] Telephone Survey: data to providers prior to load [employer] Survey: data to providers prior to load  Number falls in past yr: 0 0    Injury with Fall? 0 0    Risk for fall due to : No Fall Risks No Fall Risks    Follow up Falls evaluation completed Falls evaluation completed      FALL RISK PREVENTION PERTAINING  TO THE HOME:  Any stairs in or around the home? Yes  If so, are there any without handrails? No  Home free of loose throw rugs in walkways, pet beds, electrical cords, etc? Yes  Adequate lighting in your home to reduce risk of falls? Yes   ASSISTIVE DEVICES UTILIZED TO PREVENT FALLS:  Life alert? No  Use of a cane, walker or w/c? No  Grab bars in the bathroom? No  Shower chair or bench in shower? No  Elevated toilet seat or a handicapped toilet? No   TIMED UP AND GO:  Was the test performed? No .  Length of time to ambulate 10 feet:  sec.     Cognitive Function:        Immunizations Immunization History  Administered Date(s) Administered   Zoster Recombinat (Shingrix) 05/23/2021    TDAP status: Due, Education has been provided regarding the importance of this vaccine. Advised may receive this vaccine at local pharmacy or Health Dept. Aware to provide a copy of the vaccination record if obtained from local pharmacy or Health Dept. Verbalized acceptance and understanding.  Flu Vaccine status: Up to date  Pneumococcal vaccine status: Due, Education has been provided regarding the importance of this vaccine. Advised may receive this vaccine at local pharmacy or Health Dept. Aware to provide a copy of the vaccination record if obtained from local pharmacy or Health Dept. Verbalized acceptance and understanding.  Covid-19 vaccine status: Information provided on how to obtain vaccines.   Qualifies for Shingles Vaccine? Yes   Zostavax completed No   Shingrix Completed?: No.    Education has been provided regarding the importance of this vaccine. Patient has been advised to call insurance company to determine out of pocket expense if they have not yet received this vaccine. Advised may also receive vaccine at local pharmacy or Health Dept. Verbalized acceptance and understanding.  Screening Tests Health Maintenance  Topic Date Due   COVID-19 Vaccine (1) Never done    TETANUS/TDAP  Never done   COLONOSCOPY (Pts 45-11yrs Insurance coverage will need to be confirmed)  Never done   Pneumonia Vaccine 45+ Years old (1 - PCV) Never done   Zoster Vaccines- Shingrix (2 of 2) 07/18/2021   INFLUENZA VACCINE  09/03/2021   Hepatitis C Screening  Completed   HPV VACCINES  Aged Out    Health Maintenance  Health Maintenance Due  Topic Date Due   COVID-19 Vaccine (1) Never done   TETANUS/TDAP  Never done   COLONOSCOPY (Pts 45-31yrs Insurance coverage will need to be confirmed)  Never done   Pneumonia Vaccine 29+ Years old (1 - PCV) Never done   Zoster Vaccines- Shingrix (2 of 2) 07/18/2021      Lung Cancer Screening: (Low Dose CT Chest recommended if Age 6-80 years, 30 pack-year currently smoking OR have quit w/in 15years.) does not qualify.   Lung Cancer Screening Referral:   Additional Screening:  Hepatitis C Screening: does not qualify; Completed 05/23/21  Vision Screening: Recommended annual ophthalmology exams for early detection of glaucoma and other disorders of the eye. Is the patient up to date with their annual eye exam?  No  Who is the provider or what is the name of the office in which the patient attends annual eye exams? MY eye doctor  If pt is not established with a provider, would they like to be referred to a provider to establish care? No .   Dental Screening: Recommended annual dental exams for proper oral hygiene  Community Resource Referral / Chronic Care Management: CRR required this visit?  No   CCM required this visit?  No      Plan:     I have personally reviewed and noted the following in the patient's chart:   Medical and social history Use of alcohol, tobacco or illicit drugs  Current medications and supplements including opioid prescriptions. Patient is not currently taking opioid prescriptions. Functional ability and status Nutritional status Physical activity Advanced directives List of other  physicians Hospitalizations, surgeries, and ER visits in previous 12 months Vitals Screenings to include cognitive, depression, and falls Referrals and appointments  In addition, I have reviewed and discussed with patient certain preventive protocols, quality metrics, and best practice recommendations. A written personalized care plan for preventive services as well as general preventive health recommendations were provided to patient.     Harriet Pho, CMA   07/29/2021   Nurse Notes:

## 2021-07-30 ENCOUNTER — Encounter: Payer: Self-pay | Admitting: Family Medicine

## 2021-08-20 ENCOUNTER — Other Ambulatory Visit: Payer: Self-pay | Admitting: Family Medicine

## 2021-08-20 DIAGNOSIS — F419 Anxiety disorder, unspecified: Secondary | ICD-10-CM

## 2021-09-10 ENCOUNTER — Other Ambulatory Visit: Payer: Self-pay

## 2021-09-10 ENCOUNTER — Telehealth: Payer: Self-pay | Admitting: Family Medicine

## 2021-09-10 DIAGNOSIS — K5792 Diverticulitis of intestine, part unspecified, without perforation or abscess without bleeding: Secondary | ICD-10-CM

## 2021-09-10 MED ORDER — METRONIDAZOLE 375 MG PO CAPS: 375.0000 mg | ORAL_CAPSULE | Freq: Two times a day (BID) | ORAL | 1 refills | Status: DC

## 2021-09-10 MED ORDER — CIPROFLOXACIN HCL 750 MG PO TABS: 750.0000 mg | ORAL_TABLET | Freq: Two times a day (BID) | ORAL | 1 refills | Status: DC

## 2021-09-10 NOTE — Telephone Encounter (Signed)
yes

## 2021-09-10 NOTE — Telephone Encounter (Signed)
Patient called in for refills on  ciprofloxacin (CIPRO) 750 MG tablet  metronidazole (FLAGYL) 375 MG capsule    [facility] [address] Linna Hoff

## 2021-09-10 NOTE — Telephone Encounter (Signed)
Pt states he has diverticulitis uses it for flare ups keeps it on hand, states he mentioned this when he initiated care with [place], ok to send?

## 2021-09-10 NOTE — Telephone Encounter (Signed)
Refills sent

## 2021-09-13 ENCOUNTER — Telehealth: Payer: Self-pay | Admitting: Family Medicine

## 2021-09-13 ENCOUNTER — Other Ambulatory Visit: Payer: Self-pay

## 2021-09-13 DIAGNOSIS — K5792 Diverticulitis of intestine, part unspecified, without perforation or abscess without bleeding: Secondary | ICD-10-CM

## 2021-09-13 MED ORDER — CIPROFLOXACIN HCL 500 MG PO TABS: 500.0000 mg | ORAL_TABLET | Freq: Two times a day (BID) | ORAL | 0 refills | Status: AC

## 2021-09-13 MED ORDER — METRONIDAZOLE 500 MG PO TABS: 500.0000 mg | ORAL_TABLET | Freq: Two times a day (BID) | ORAL | 0 refills | Status: AC

## 2021-09-13 NOTE — Telephone Encounter (Signed)
Spoke to pt states his medications have been sent in wrong multiple times unhappy about this, I let him know this was in his chart hx that is why it was sent in as refill from that, pt states rx should have been cipro '500mg'$  2x a day qty 20, and flagyl '500mg'$  2x a day qty of 20, I corrected this in his chart and resent the prescriptions, called the pharmacy to let them know what was going on, pt states he would like a new pcp wants to get established with the new MD coming will call next week to ask about an appt with him, forwarding to provider to inform.

## 2021-09-13 NOTE — Telephone Encounter (Signed)
Pt called stating his medication has been messed up. Wants to know if you can please call him??

## 2021-09-16 ENCOUNTER — Telehealth: Payer: Self-pay | Admitting: Internal Medicine

## 2021-09-16 NOTE — Telephone Encounter (Signed)
Patient requested to change to Dr Doren Custard because his meds have been mixed up several times per patient.  He has talked with Peter Congo and we are looking into the medication issue.  I have Brandi reviewing the entire chart to find out what happened and if any internal processed need to changed.  I have talked with the patient and I am moving him to Dr Doren Custard.

## 2021-10-22 DIAGNOSIS — H40003 Preglaucoma, unspecified, bilateral: Secondary | ICD-10-CM | POA: Diagnosis not present

## 2021-11-22 ENCOUNTER — Encounter: Payer: Self-pay | Admitting: Internal Medicine

## 2021-11-22 ENCOUNTER — Ambulatory Visit (INDEPENDENT_AMBULATORY_CARE_PROVIDER_SITE_OTHER): Payer: PPO | Admitting: Internal Medicine

## 2021-11-22 VITALS — BP 145/79 | HR 93 | Ht 68.0 in | Wt 182.8 lb

## 2021-11-22 DIAGNOSIS — Z23 Encounter for immunization: Secondary | ICD-10-CM | POA: Diagnosis not present

## 2021-11-22 DIAGNOSIS — Z1211 Encounter for screening for malignant neoplasm of colon: Secondary | ICD-10-CM | POA: Diagnosis not present

## 2021-11-22 DIAGNOSIS — Z0000 Encounter for general adult medical examination without abnormal findings: Secondary | ICD-10-CM

## 2021-11-22 DIAGNOSIS — E785 Hyperlipidemia, unspecified: Secondary | ICD-10-CM | POA: Diagnosis not present

## 2021-11-22 DIAGNOSIS — F32A Depression, unspecified: Secondary | ICD-10-CM

## 2021-11-22 DIAGNOSIS — E1169 Type 2 diabetes mellitus with other specified complication: Secondary | ICD-10-CM | POA: Diagnosis not present

## 2021-11-22 DIAGNOSIS — E119 Type 2 diabetes mellitus without complications: Secondary | ICD-10-CM | POA: Diagnosis not present

## 2021-11-22 DIAGNOSIS — Z0001 Encounter for general adult medical examination with abnormal findings: Secondary | ICD-10-CM

## 2021-11-22 DIAGNOSIS — I10 Essential (primary) hypertension: Secondary | ICD-10-CM

## 2021-11-22 DIAGNOSIS — Z1212 Encounter for screening for malignant neoplasm of rectum: Secondary | ICD-10-CM | POA: Diagnosis not present

## 2021-11-22 NOTE — Patient Instructions (Signed)
It was a pleasure to see you today.  Thank you for giving [place] the opportunity to be involved in your care.  Below is a brief recap of your visit and next steps.  We will plan to see you again in 1 month.  Summary We will repeat labs today You will receive flu and shingles vaccines  Next steps Follow up in 1 month I will notify you of lab results

## 2021-11-22 NOTE — Progress Notes (Signed)
Established Patient Office Visit  Subjective   Patient ID: Robert Walker, male    DOB: [birth year]  Age: 71 y.o. MRN: [id number]  Chief Complaint  Patient presents with   Follow-up   Robert Walker returns to care today.  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  He was last seen at [facility] on 05/23/2021 by Robert Monday, NP as a new patient establishing care. He was referred to [facility] for management of a ganglion cyst on the volar aspect of his right wrist.  No additional medication changes were made.  In the interim, he has been seen at [facility] for OA of the [facility] of the right thumb.  There have otherwise been no acute interval events.  Today Robert Walker states that he feels well.  He has no acute concerns today and is asymptomatic.  Chronic medical conditions and outstanding preventative healthcare maintenance items discussed today are individually addressed in A/P below.   History reviewed. No pertinent past medical history. History reviewed. No pertinent surgical history. Social History   Tobacco Use   Smoking status: Former    Years: 10.00    Types: Cigarettes    Quit date: 1985    Years since quitting: 38.8    Passive exposure: Never   Smokeless tobacco: Never  Substance Use Topics   Alcohol use: Not Currently   Drug use: Never   History reviewed. No pertinent family history. Allergies  Allergen Reactions   Bee Venom Anaphylaxis   Review of Systems  Constitutional:  Negative for chills and fever.  HENT:  Negative for sore throat.   Respiratory:  Negative for cough and shortness of breath.   Cardiovascular:  Negative for chest pain, palpitations and leg swelling.  Gastrointestinal:  Negative for abdominal pain, blood in stool, constipation, diarrhea, nausea and vomiting.  Genitourinary:  Negative for dysuria and hematuria.  Musculoskeletal:  Negative for  myalgias.  Skin:  Negative for itching and rash.  Neurological:  Negative for dizziness and headaches.  Psychiatric/Behavioral:  Negative for depression and suicidal ideas.      Objective:     BP (!) 145/79   Pulse 93   Ht '5\' 8"'  (1.727 m)   Wt 182 lb 12.8 oz (82.9 kg)   SpO2 93%   BMI 27.79 kg/m  BP Readings from Last 3 Encounters:  11/22/21 (!) 145/79  05/23/21 134/84  08/12/16 (!) 154/88   Physical Exam Vitals reviewed.  Constitutional:      General: He is not in acute distress.    Appearance: Normal appearance. He is not ill-appearing.  HENT:     Head: Normocephalic and atraumatic.     Nose: Nose normal. No congestion or rhinorrhea.     Mouth/Throat:     Mouth: Mucous membranes are moist.     Pharynx: Oropharynx is clear.  Eyes:     Extraocular Movements: Extraocular movements intact.     Conjunctiva/sclera: Conjunctivae normal.     Pupils: Pupils are equal, round, and reactive to light.  Cardiovascular:     Rate and Rhythm: Normal rate and regular rhythm.     Pulses: Normal pulses.     Heart sounds: Normal heart sounds. No murmur heard. Pulmonary:     Effort: Pulmonary effort is normal.     Breath sounds: Normal breath sounds. No wheezing, rhonchi or rales.  Abdominal:     General: Abdomen  is flat. Bowel sounds are normal. There is no distension.     Palpations: Abdomen is soft.     Tenderness: There is no abdominal tenderness.  Musculoskeletal:        General: No swelling or deformity. Normal range of motion.     Cervical back: Normal range of motion.  Skin:    General: Skin is warm and dry.     Capillary Refill: Capillary refill takes less than 2 seconds.  Neurological:     General: No focal deficit present.     Mental Status: He is alert and oriented to person, place, and time.     Motor: No weakness.  Psychiatric:        Mood and Affect: Mood normal.        Behavior: Behavior normal.        Thought Content: Thought content normal.    Last  CBC Lab Results  Component Value Date   WBC 5.9 11/22/2021   HGB 15.4 11/22/2021   HCT 45.5 11/22/2021   MCV 93 11/22/2021   MCH 31.3 11/22/2021   RDW 12.1 11/22/2021   PLT 347 99/24/2683   Last metabolic panel Lab Results  Component Value Date   GLUCOSE 116 (H) 11/22/2021   NA 135 11/22/2021   K 4.3 11/22/2021   CL 97 11/22/2021   CO2 22 11/22/2021   BUN 12 11/22/2021   CREATININE 0.86 11/22/2021   EGFR 92 11/22/2021   CALCIUM 9.8 11/22/2021   PROT 7.0 11/22/2021   ALBUMIN 4.8 11/22/2021   LABGLOB 2.2 11/22/2021   AGRATIO 2.2 11/22/2021   BILITOT 0.3 11/22/2021   ALKPHOS 52 11/22/2021   AST 37 11/22/2021   ALT 31 11/22/2021   Last lipids Lab Results  Component Value Date   CHOL 135 11/22/2021   HDL 35 (L) 11/22/2021   LDLCALC 49 11/22/2021   TRIG 334 (H) 11/22/2021   CHOLHDL 3.9 11/22/2021   Last hemoglobin A1c Lab Results  Component Value Date   HGBA1C 7.2 (H) 11/22/2021   Last thyroid functions Lab Results  Component Value Date   TSH 1.180 05/23/2021   Last vitamin D Lab Results  Component Value Date   VD25OH 34.6 05/23/2021   The 10-year ASCVD risk score (Arnett DK, et al., 2019) is: 47%    Assessment & Plan:   Problem List Items Addressed This Visit       Essential hypertension    BP 145/79 today.  He is currently prescribed lisinopril-HCTZ 20-12.5 mg daily.  He checks his blood pressure at home and reports systolic readings consistently below 130. -No medication changes today -Follow in 1 month for BP check.  Can consider escalation of lisinopril-HCTZ if BP remains above goal.      Hyperlipidemia associated with type 2 diabetes mellitus ([place])    Currently prescribed simvastatin 40 mg daily.  Total cholesterol and LDL well controlled on lipid panel from 6 months ago.  His ASCVD risk is significantly elevated. -I reviewed with Robert Walker that high intensity statin therapy is indicated based on his elevated 10-year ASCVD risk.  Through shared  decision making, we will repeat labs today and follow-up in 1 month to review results and discuss any necessary medication changes.      Type 2 diabetes mellitus without complications (HCC)    M1D 7.0 6 months ago.  Not on medications currently.  Denies polyuria/polydipsia. -Repeat A1c today -Follow-up on additional DM related preventative care items at follow-up in 1 month  Depression    Symptoms well controlled on citalopram 20 mg daily.      Preventative health care    Presenting today for follow-up.  Repeat labs ordered. -Influenza vaccine administered today -He will receive his second zoster vaccine today as well -Cologuard ordered for colorectal cancer screening -Follow-up in 1 month to review labs and further address outstanding preventative care items      Return in about 4 weeks (around 12/20/2021).    Johnette Abraham, MD

## 2021-11-23 LAB — CMP14+EGFR
ALT: 31 IU/L (ref 0–44)
AST: 37 IU/L (ref 0–40)
Albumin/Globulin Ratio: 2.2 (ref 1.2–2.2)
Albumin: 4.8 g/dL (ref 3.8–4.8)
Alkaline Phosphatase: 52 IU/L (ref 44–121)
BUN/Creatinine Ratio: 14 (ref 10–24)
BUN: 12 mg/dL (ref 8–27)
Bilirubin Total: 0.3 mg/dL (ref 0.0–1.2)
CO2: 22 mmol/L (ref 20–29)
Calcium: 9.8 mg/dL (ref 8.6–10.2)
Chloride: 97 mmol/L (ref 96–106)
Creatinine, Ser: 0.86 mg/dL (ref 0.76–1.27)
Globulin, Total: 2.2 g/dL (ref 1.5–4.5)
Glucose: 116 mg/dL — ABNORMAL HIGH (ref 70–99)
Potassium: 4.3 mmol/L (ref 3.5–5.2)
Sodium: 135 mmol/L (ref 134–144)
Total Protein: 7 g/dL (ref 6.0–8.5)
eGFR: 92 mL/min/{1.73_m2} (ref >59)

## 2021-11-23 LAB — CBC WITH DIFFERENTIAL/PLATELET
Basophils Absolute: 0.1 10*3/uL (ref 0.0–0.2)
Basos: 1 %
EOS (ABSOLUTE): 0.1 10*3/uL (ref 0.0–0.4)
Eos: 2 %
Hematocrit: 45.5 % (ref 37.5–51.0)
Hemoglobin: 15.4 g/dL (ref 13.0–17.7)
Immature Grans (Abs): 0 10*3/uL (ref 0.0–0.1)
Immature Granulocytes: 0 %
Lymphocytes Absolute: 1.5 10*3/uL (ref 0.7–3.1)
Lymphs: 26 %
MCH: 31.3 pg (ref 26.6–33.0)
MCHC: 33.8 g/dL (ref 31.5–35.7)
MCV: 93 fL (ref 79–97)
Monocytes Absolute: 0.8 10*3/uL (ref 0.1–0.9)
Monocytes: 13 %
Neutrophils Absolute: 3.4 10*3/uL (ref 1.4–7.0)
Neutrophils: 58 %
Platelets: 347 10*3/uL (ref 150–450)
RBC: 4.92 x10E6/uL (ref 4.14–5.80)
RDW: 12.1 % (ref 11.6–15.4)
WBC: 5.9 10*3/uL (ref 3.4–10.8)

## 2021-11-23 LAB — HEMOGLOBIN A1C
Est. average glucose Bld gHb Est-mCnc: 160 mg/dL
Hgb A1c MFr Bld: 7.2 % — ABNORMAL HIGH (ref 4.8–5.6)

## 2021-11-23 LAB — LIPID PANEL
Chol/HDL Ratio: 3.9 ratio (ref 0.0–5.0)
Cholesterol, Total: 135 mg/dL (ref 100–199)
HDL: 35 mg/dL — ABNORMAL LOW (ref >39)
LDL Chol Calc (NIH): 49 mg/dL (ref 0–99)
Triglycerides: 334 mg/dL — ABNORMAL HIGH (ref 0–149)
VLDL Cholesterol Cal: 51 mg/dL — ABNORMAL HIGH (ref 5–40)

## 2021-12-02 DIAGNOSIS — E785 Hyperlipidemia, unspecified: Secondary | ICD-10-CM | POA: Insufficient documentation

## 2021-12-02 DIAGNOSIS — Z1211 Encounter for screening for malignant neoplasm of colon: Secondary | ICD-10-CM | POA: Diagnosis not present

## 2021-12-02 DIAGNOSIS — E1169 Type 2 diabetes mellitus with other specified complication: Secondary | ICD-10-CM | POA: Insufficient documentation

## 2021-12-02 DIAGNOSIS — I10 Essential (primary) hypertension: Secondary | ICD-10-CM | POA: Insufficient documentation

## 2021-12-02 DIAGNOSIS — E119 Type 2 diabetes mellitus without complications: Secondary | ICD-10-CM | POA: Insufficient documentation

## 2021-12-02 DIAGNOSIS — F32A Depression, unspecified: Secondary | ICD-10-CM | POA: Insufficient documentation

## 2021-12-02 DIAGNOSIS — Z0000 Encounter for general adult medical examination without abnormal findings: Secondary | ICD-10-CM | POA: Insufficient documentation

## 2021-12-02 DIAGNOSIS — Z1212 Encounter for screening for malignant neoplasm of rectum: Secondary | ICD-10-CM | POA: Diagnosis not present

## 2021-12-02 NOTE — Assessment & Plan Note (Signed)
Currently prescribed simvastatin 40 mg daily.  Total cholesterol and LDL well controlled on lipid panel from 6 months ago.  His ASCVD risk is significantly elevated. -I reviewed with Robert Walker that high intensity statin therapy is indicated based on his elevated 10-year ASCVD risk.  Through shared decision making, we will repeat labs today and follow-up in 1 month to review results and discuss any necessary medication changes.

## 2021-12-02 NOTE — Assessment & Plan Note (Signed)
Presenting today for follow-up.  Repeat labs ordered. -Influenza vaccine administered today -He will receive his second zoster vaccine today as well -Cologuard ordered for colorectal cancer screening -Follow-up in 1 month to review labs and further address outstanding preventative care items

## 2021-12-02 NOTE — Assessment & Plan Note (Signed)
A1c 7.0 6 months ago.  Not on medications currently.  Denies polyuria/polydipsia. -Repeat A1c today -Follow-up on additional DM related preventative care items at follow-up in 1 month

## 2021-12-02 NOTE — Assessment & Plan Note (Signed)
BP 145/79 today.  He is currently prescribed lisinopril-HCTZ 20-12.5 mg daily.  He checks his blood pressure at home and reports systolic readings consistently below 130. -No medication changes today -Follow in 1 month for BP check.  Can consider escalation of lisinopril-HCTZ if BP remains above goal.

## 2021-12-02 NOTE — Assessment & Plan Note (Signed)
Symptoms well controlled on citalopram 20 mg daily.

## 2021-12-10 LAB — COLOGUARD: COLOGUARD: POSITIVE — AB

## 2021-12-11 ENCOUNTER — Encounter: Payer: Self-pay | Admitting: Gastroenterology

## 2021-12-11 ENCOUNTER — Other Ambulatory Visit: Payer: Self-pay | Admitting: Internal Medicine

## 2021-12-11 DIAGNOSIS — R195 Other fecal abnormalities: Secondary | ICD-10-CM

## 2021-12-20 ENCOUNTER — Encounter: Payer: Self-pay | Admitting: Internal Medicine

## 2021-12-20 ENCOUNTER — Ambulatory Visit (INDEPENDENT_AMBULATORY_CARE_PROVIDER_SITE_OTHER): Payer: PPO | Admitting: Internal Medicine

## 2021-12-20 VITALS — BP 134/90 | HR 100 | Ht 68.0 in | Wt 180.0 lb

## 2021-12-20 DIAGNOSIS — E119 Type 2 diabetes mellitus without complications: Secondary | ICD-10-CM | POA: Diagnosis not present

## 2021-12-20 DIAGNOSIS — R195 Other fecal abnormalities: Secondary | ICD-10-CM | POA: Insufficient documentation

## 2021-12-20 DIAGNOSIS — I10 Essential (primary) hypertension: Secondary | ICD-10-CM

## 2021-12-20 DIAGNOSIS — E1169 Type 2 diabetes mellitus with other specified complication: Secondary | ICD-10-CM | POA: Diagnosis not present

## 2021-12-20 DIAGNOSIS — E785 Hyperlipidemia, unspecified: Secondary | ICD-10-CM

## 2021-12-20 NOTE — Patient Instructions (Signed)
It was a pleasure to see you today.  Thank you for giving [place] the opportunity to be involved in your care.  Below is a brief recap of your visit and next steps.  We will plan to see you again in February.  Summary No medication changes today. Things look stable. We will follow up in 3 months to repeat your A1c.

## 2021-12-20 NOTE — Assessment & Plan Note (Signed)
Recent positive result on Cologuard test for colorectal cancer screening.  His last colonoscopy was in 2012.  I have referred him to GI for screening colonoscopy.  He expresses concern.  Prospect of undergoing a repeat colonoscopy due to complications from his previous colonoscopy.  He has an appointment scheduled with GI on 12/12 to discuss his concerns.

## 2021-12-20 NOTE — Progress Notes (Signed)
Established Patient Office Visit  Subjective   Patient ID: Robert Walker, male    DOB: 18-Feb-1950  Age: 71 y.o. MRN: [id number]  Chief Complaint  Patient presents with   Follow-up   Robert Walker returns to care today for HTN follow-up and to review labs.  He was last seen by me on 10/20 at which time his blood pressure was elevated.  Repeat labs were ordered and 4-week follow-up was arranged.  Cologuard was also ordered for colorectal cancer screening.  The result came back positive.  He was referred to gastroenterology for screening colonoscopy.  He has an appointment with GI scheduled for 12/12.  There have been no acute interval events.  Robert Walker is asymptomatic currently.  He is concerned about his Cologuard result and the prospect of undergoing a repeat colonoscopy.  His last colonoscopy was in [place] before moving to [place].  He has a history of diverticulosis and is aware that he is at an increased risk for perforation.  He would like for gastroenterology to review his previous colonoscopy report prior to performing a repeat colonoscopy.  Acute concerns, chronic medical conditions, and outstanding preventative care items discussed today are individually addressed in A/P below.  History reviewed. No pertinent past medical history. History reviewed. No pertinent surgical history. Social History   Tobacco Use   Smoking status: Former    Years: 10.00    Types: Cigarettes    Quit date: 1985    Years since quitting: 38.9    Passive exposure: Never   Smokeless tobacco: Never  Substance Use Topics   Alcohol use: Not Currently   Drug use: Never   History reviewed. No pertinent family history. Allergies  Allergen Reactions   Bee Venom Anaphylaxis   Review of Systems  Constitutional:  Negative for chills and fever.  HENT:  Negative for sore throat.   Respiratory:  Negative for cough and shortness of breath.   Cardiovascular:  Negative for chest pain, palpitations and leg swelling.   Gastrointestinal:  Negative for abdominal pain, blood in stool, constipation, diarrhea, nausea and vomiting.  Genitourinary:  Negative for dysuria and hematuria.  Musculoskeletal:  Negative for myalgias.  Skin:  Negative for itching and rash.  Neurological:  Negative for dizziness and headaches.  Psychiatric/Behavioral:  Negative for depression and suicidal ideas.      Objective:     BP (!) 134/90   Pulse 100   Ht _0  (1.727 m)   Wt 180 lb (81.6 kg)   SpO2 94%   BMI 27.37 kg/m  BP Readings from Last 3 Encounters:  12/20/21 (!) 134/90  11/22/21 (!) 145/79  05/23/21 134/84   Physical Exam Vitals reviewed.  Constitutional:      General: He is not in acute distress.    Appearance: Normal appearance. He is not ill-appearing.  HENT:     Head: Normocephalic and atraumatic.     Nose: Nose normal. No congestion or rhinorrhea.     Mouth/Throat:     Mouth: Mucous membranes are moist.     Pharynx: Oropharynx is clear.  Eyes:     Extraocular Movements: Extraocular movements intact.     Conjunctiva/sclera: Conjunctivae normal.     Pupils: Pupils are equal, round, and reactive to light.  Cardiovascular:     Rate and Rhythm: Normal rate and regular rhythm.     Pulses: Normal pulses.     Heart sounds: Normal heart sounds. No murmur heard. Pulmonary:     Effort:  Pulmonary effort is normal.     Breath sounds: Normal breath sounds. No wheezing, rhonchi or rales.  Abdominal:     General: Abdomen is flat. Bowel sounds are normal. There is no distension.     Palpations: Abdomen is soft.     Tenderness: There is no abdominal tenderness.  Musculoskeletal:        General: No swelling or deformity. Normal range of motion.     Cervical back: Normal range of motion.  Skin:    General: Skin is warm and dry.     Capillary Refill: Capillary refill takes less than 2 seconds.  Neurological:     General: No focal deficit present.     Mental Status: He is alert and oriented to person, place,  and time.     Motor: No weakness.  Psychiatric:        Mood and Affect: Mood normal.        Behavior: Behavior normal.        Thought Content: Thought content normal.    Diabetic foot exam was performed.  No deformities or other abnormal visual findings.  Posterior tibialis and dorsalis pulse intact bilaterally.  Intact to touch and monofilament testing bilaterally.    Last CBC Lab Results  Component Value Date   WBC 5.9 11/22/2021   HGB 15.4 11/22/2021   HCT 45.5 11/22/2021   MCV 93 11/22/2021   MCH 31.3 11/22/2021   RDW 12.1 11/22/2021   PLT 347 35/46/5681   Last metabolic panel Lab Results  Component Value Date   GLUCOSE 116 (H) 11/22/2021   NA 135 11/22/2021   K 4.3 11/22/2021   CL 97 11/22/2021   CO2 22 11/22/2021   BUN 12 11/22/2021   CREATININE 0.86 11/22/2021   EGFR 92 11/22/2021   CALCIUM 9.8 11/22/2021   PROT 7.0 11/22/2021   ALBUMIN 4.8 11/22/2021   LABGLOB 2.2 11/22/2021   AGRATIO 2.2 11/22/2021   BILITOT 0.3 11/22/2021   ALKPHOS 52 11/22/2021   AST 37 11/22/2021   ALT 31 11/22/2021   Last lipids Lab Results  Component Value Date   CHOL 135 11/22/2021   HDL 35 (L) 11/22/2021   LDLCALC 49 11/22/2021   TRIG 334 (H) 11/22/2021   CHOLHDL 3.9 11/22/2021   Last hemoglobin A1c Lab Results  Component Value Date   HGBA1C 7.2 (H) 11/22/2021   Last thyroid functions Lab Results  Component Value Date   TSH 1.180 05/23/2021   Last vitamin D Lab Results  Component Value Date   VD25OH 34.6 05/23/2021   The 10-year ASCVD risk score (Arnett DK, et al., 2019) is: 42.4%    Assessment & Plan:   Problem List Items Addressed This Visit       Essential hypertension - Primary    His initial blood pressure today was 154/73, improved to 134/90 on repeat.  He is currently prescribed lisinopril-HCTZ 20-12.5 mg daily.  He has been checking his blood pressure at home and his readings are consistently within goal. -No medication changes today given adequate  control on home readings      Hyperlipidemia associated with type 2 diabetes mellitus ([place])    Currently prescribed simvastatin 40 mg daily.  His lipid panel was updated last month.  Total cholesterol 135, LDL 49.  His 10-year ASCVD risk score is significantly elevated at 42.4%. -Through shared decision making, we will continue simvastatin 40 mg daily.  He is making significant dietary changes and we will repeat a lipid panel  in 3 months.      Type 2 diabetes mellitus without complications ([facility])    R9F 7.2 last month.  He is not on any medication currently and has previously been well controlled on diet alone. -Through shared decision making, he would like to make lifestyle modifications, namely diet changes, and repeat A1c in 3 months.  If his A1c remains above goal that time, he is in agreement with starting medication for treatment of diabetes.  Consider GLP-1 therapy.      Positive colorectal cancer screening using Cologuard test    Recent positive result on Cologuard test for colorectal cancer screening.  His last colonoscopy was in 2012.  I have referred him to GI for screening colonoscopy.  He expresses concern.  Prospect of undergoing a repeat colonoscopy due to complications from his previous colonoscopy.  He has an appointment scheduled with GI on 12/12 to discuss his concerns.       Return in about 3 months (around 03/22/2022).    Johnette Abraham, MD

## 2021-12-20 NOTE — Assessment & Plan Note (Signed)
Currently prescribed simvastatin 40 mg daily.  His lipid panel was updated last month.  Total cholesterol 135, LDL 49.  His 10-year ASCVD risk score is significantly elevated at 42.4%. -Through shared decision making, we will continue simvastatin 40 mg daily.  He is making significant dietary changes and we will repeat a lipid panel in 3 months.

## 2021-12-20 NOTE — Assessment & Plan Note (Signed)
A1c 7.2 last month.  He is not on any medication currently and has previously been well controlled on diet alone. -Through shared decision making, he would like to make lifestyle modifications, namely diet changes, and repeat A1c in 3 months.  If his A1c remains above goal that time, he is in agreement with starting medication for treatment of diabetes.  Consider GLP-1 therapy.

## 2021-12-20 NOTE — Assessment & Plan Note (Signed)
His initial blood pressure today was 154/73, improved to 134/90 on repeat.  He is currently prescribed lisinopril-HCTZ 20-12.5 mg daily.  He has been checking his blood pressure at home and his readings are consistently within goal. -No medication changes today given adequate control on home readings

## 2022-01-12 ENCOUNTER — Encounter: Payer: Self-pay | Admitting: Gastroenterology

## 2022-01-12 NOTE — Progress Notes (Unsigned)
GI Office Note    Referring Provider: Johnette Abraham, MD Primary Care Physician:  Johnette Abraham, MD  Primary Gastroenterologist: Elon Alas. Abbey Chatters, DO  Chief Complaint   Chief Complaint  Patient presents with   positive cologuard test    New patient. Referred for positive cologuard test.     History of Present Illness   Robert Walker is a 71 y.o. male presenting today at the request of Johnette Abraham, MD due to colon cancer screening with positive Cologuard.  PCP note 11/2020 reported patient mentioned history of diverticulitis with a recent flare.  Treated with Cipro and Flagyl.  Noted to have history of elevated liver enzymes, suspect to be related to alcohol use.  Reportedly currently in remission.  Continue to monitor LFTs.  Labs 11/22/2021: CBC normal, elevated triglycerides, low HDL.  A1c 7.2.  Normal LFTs and electrolytes.  Glucose mildly elevated at 116.   Today: Diverticulitis - Has intermittent bouts. Typically has about 2 per year and keeps cipro and flagyl on hand. Thinks he has one early in the year.   Denies constipation, diarrhea, abdominal pain, N/V, unintentional weight loss, lack of appetite, reflux, early satiety, melena, brbpr. Has had positive FOBTs in the past. Denies frequent NSAID use. No alcohol or tobacco use. Quit smoking in the mid 80s.   Family history of colon cancer/colon polyp None.    Has never had polyps on piror colonoscopies. Thinks his last colonoscopy was 7 yeas ago. Was performed in [place]. Was told he had severe diverticulosis. He states he has had a bout 10 total colonoscopies in his lifetime that have all been normal and is most concerned about the risk of perforation. Has reservations about having another one performed and thinks Cologuard may be a false positive.    Current Outpatient Medications  Medication Sig Dispense Refill   aspirin EC 81 MG tablet Take 81 mg by mouth daily. Swallow whole.     citalopram  (CELEXA) 20 MG tablet Take 1 tablet (20 mg total) by mouth daily. 90 tablet 3   Flaxseed, Linseed, (FLAXSEED OIL) 1000 MG CAPS Take by mouth.     Lactobacillus-Inulin (CULTURELLE ADULT ULT BALANCE) CAPS Take by mouth in the morning and at bedtime.     lisinopril-hydrochlorothiazide (ZESTORETIC) 20-12.5 MG tablet Take 1 tablet by mouth daily. 90 tablet 3   Misc Natural Products (PROSTATE SUPPORT PO) Take by mouth. 3 per day     Multiple Vitamins-Minerals (PRESERVISION AREDS 2 PO) Take by mouth. 2 per day     simvastatin (ZOCOR) 40 MG tablet Take 1 tablet (40 mg total) by mouth at bedtime. 90 tablet 3   Turmeric (QC TUMERIC COMPLEX PO) Take by mouth. One daily     Wheat Dextrin (BENEFIBER PO) Take by mouth. Once daily     No current facility-administered medications for this visit.    Past Medical History:  Diagnosis Date   Alcoholism ([place])    quit in 2022   Depression    Diabetes ([place])    HLD (hyperlipidemia)    Hypertension     Past Surgical History:  Procedure Laterality Date   HERNIA REPAIR     TONSILLECTOMY     age 21    History reviewed. No pertinent family history.  Allergies as of 01/14/2022 - Review Complete 01/14/2022  Allergen Reaction Noted   Bee venom Anaphylaxis 05/23/2021    Social History   Socioeconomic History   Marital status: Married  Spouse name: Not on file   Number of children: Not on file   Years of education: Not on file   Highest education level: Not on file  Occupational History   Not on file  Tobacco Use   Smoking status: Former    Years: 10.00    Types: Cigarettes    Quit date: 40    Years since quitting: 38.9    Passive exposure: Past   Smokeless tobacco: Never  Substance and Sexual Activity   Alcohol use: Not Currently   Drug use: Never   Sexual activity: Not on file  Other Topics Concern   Not on file  Social History Narrative   Not on file   Social Determinants of Health   Financial Resource Strain: Not on file  Food  Insecurity: Not on file  Transportation Needs: Not on file  Physical Activity: Not on file  Stress: Not on file  Social Connections: Not on file  Intimate Partner Violence: Not on file     Review of Systems   Gen: Denies any fever, chills, fatigue, weight loss, lack of appetite.  CV: Denies chest pain, heart palpitations, peripheral edema, syncope.  Resp: Denies shortness of breath at rest or with exertion. Denies wheezing or cough.  GI: see HPI GU : Denies urinary burning, urinary frequency, urinary hesitancy MS: Denies joint pain, muscle weakness, cramps, or limitation of movement.  Derm: Denies rash, itching, dry skin Psych: Denies depression, anxiety, memory loss, and confusion Heme: Denies bruising, bleeding, and enlarged lymph nodes.   Physical Exam   BP 124/85 (BP Location: Left Arm, Patient Position: Sitting, Cuff Size: Large)   Pulse 91   Temp 98 F (36.7 C) (Oral)   Ht '5\' 8"'$  (1.727 m)   Wt 180 lb 14.4 oz (82.1 kg)   BMI 27.51 kg/m   General:   Alert and oriented. Pleasant and cooperative. Well-nourished and well-developed.  Head:  Normocephalic and atraumatic. Eyes:  Without icterus, sclera clear and conjunctiva pink.  Ears:  Normal auditory acuity. Mouth:  No deformity or lesions, oral mucosa pink.  Lungs:  Clear to auscultation bilaterally. No wheezes, rales, or rhonchi. No distress.  Heart:  S1, S2 present without murmurs appreciated.  Abdomen:  +BS, soft, non-tender and non-distended. No HSM noted. No guarding or rebound. No masses appreciated.  Rectal:  Deferred  Msk:  Symmetrical without gross deformities. Normal posture. Extremities:  Without edema. Neurologic:  Alert and  oriented x4;  grossly normal neurologically. Skin:  Intact without significant lesions or rashes. Psych:  Alert and cooperative. Normal mood and affect.   Assessment   Robert Walker is a 71 y.o. male with a history of type 2 diabetes, HLD, depression, elevated LFTs in October 2022,  prior alcohol use, diverticulitis, HTN presenting today to discuss positive Cologuard.  Positive Cologuard, screening for colon cancer: Recent positive Cologuard 12/02/2021.  Currently without any alarm symptoms.  Denies any constipation, diarrhea, abdominal pain, nausea/vomiting, unintentional weight loss, lack of appetite, melena, or BRBPR.  Does report history of prior positive FOBT's in the past.  History of alcohol use, however quit a few years ago.  No concerning symptoms other than diverticulitis, stated below.  For now we will request his prior colonoscopy records and discuss further with Dr. Abbey Chatters regarding need for patient to repeat a colonoscopy given no acute symptoms and no history of colon polyps.  Also without a family history of colon cancer or colon polyps.  We discussed the possible false  positives with Cologuard which she feels is the case here.  We discussed by not completing a colonoscopy that we could be missing a potential malignancy, patient indicates his understanding and if he were to develop any symptoms that he would follow-up in the office.  History of diverticulitis: Patient reportedly has been dealing with this for many years, typically has about 2 episodes per year.  He keeps Cipro and Flagyl on hand for this.  Due to this he has had multiple colonoscopies in the past revealing severe sigmoid diverticulosis.  All of his colonoscopies were performed in [place].  Denies any overt constipation.  We will request prior records from [facility] in [place] as stated above.  Advised to continue high-fiber diet and daily fiber supplementation to avoid constipation.  PLAN   Request records form [facility]. (Colonoscopy report and pathology) Consult with Dr. Abbey Chatters regarding timing and need for colonoscopy after records received and reviewed.  Further recommendations to follow. High-fiber diet Daily fiber supplementation Follow-up as  needed  Addendum: Received records from [facility].  Last colonoscopy dated 10/22/2010.  Which reports a significant amount of diverticula and a tortuous colon.  Large diverticula seen all the way to the splenic flexure and decreasing resolved into the transverse colon.  He was recommended to have a daily probiotic and increase fiber with a low residual diet.  No polyps noted.  Spoke with patient and discussed with him that his last colonoscopy was 11 years ago, and that given guidelines including having a positive Cologuard that a colonoscopy is recommended given we could be potentially missing a possible malignancy.  He has strong feelings against completing a colonoscopy unless it is felt to be absolutely necessary given his concerns about perforation.  Will still plan to discuss this with Dr. Abbey Chatters to see if he has any strong opinions about completing colonoscopy, he expressed if there are high concerns that he is open to having another discussion regarding completing it.  Venetia Night, MSN, FNP-BC, AGACNP-BC [facility]

## 2022-01-14 ENCOUNTER — Encounter: Payer: Self-pay | Admitting: Gastroenterology

## 2022-01-14 ENCOUNTER — Ambulatory Visit (INDEPENDENT_AMBULATORY_CARE_PROVIDER_SITE_OTHER): Payer: PPO | Admitting: Gastroenterology

## 2022-01-14 VITALS — BP 124/85 | HR 91 | Temp 98.0°F | Ht 68.0 in | Wt 180.9 lb

## 2022-01-14 DIAGNOSIS — Z8719 Personal history of other diseases of the digestive system: Secondary | ICD-10-CM

## 2022-01-14 DIAGNOSIS — R195 Other fecal abnormalities: Secondary | ICD-10-CM | POA: Diagnosis not present

## 2022-01-14 NOTE — Patient Instructions (Addendum)
We will request your prior colonoscopy records from [place] and then be in touch regarding timing or if repeat is indicated.   If you develop any dark/tarry stools or blood in your stool please contact the office as this would warrant need for colonoscopy.   Continue a high fiber diet and fiber supplement and avoid constipation to keep diverticulitis at [place].   I hope you have a wonderful christmas and a happy new year!  It was a pleasure to see you today. I want to create trusting relationships with patients. If you receive a survey regarding your visit,  I greatly appreciate you taking time to fill this out on paper or through your MyChart. I value your feedback.  Venetia Night, MSN, FNP-BC, AGACNP-BC [facility]

## 2022-01-29 DIAGNOSIS — H40013 Open angle with borderline findings, low risk, bilateral: Secondary | ICD-10-CM | POA: Diagnosis not present

## 2022-02-18 DIAGNOSIS — H43392 Other vitreous opacities, left eye: Secondary | ICD-10-CM | POA: Diagnosis not present

## 2022-03-17 ENCOUNTER — Other Ambulatory Visit: Payer: Self-pay

## 2022-03-17 ENCOUNTER — Telehealth: Payer: Self-pay | Admitting: Internal Medicine

## 2022-03-17 DIAGNOSIS — E1169 Type 2 diabetes mellitus with other specified complication: Secondary | ICD-10-CM

## 2022-03-17 DIAGNOSIS — E119 Type 2 diabetes mellitus without complications: Secondary | ICD-10-CM

## 2022-03-17 NOTE — Telephone Encounter (Signed)
Patient called in regard to A1C   Wants to know if will need to be checked before appt on 2/22.  No orders placed.   Patient wants a call back.

## 2022-03-17 NOTE — Telephone Encounter (Signed)
Placed orders for blood work to be done before next appt.

## 2022-03-24 ENCOUNTER — Ambulatory Visit: Payer: PPO | Admitting: Internal Medicine

## 2022-03-25 DIAGNOSIS — E785 Hyperlipidemia, unspecified: Secondary | ICD-10-CM | POA: Diagnosis not present

## 2022-03-25 DIAGNOSIS — E1169 Type 2 diabetes mellitus with other specified complication: Secondary | ICD-10-CM | POA: Diagnosis not present

## 2022-03-25 DIAGNOSIS — E119 Type 2 diabetes mellitus without complications: Secondary | ICD-10-CM | POA: Diagnosis not present

## 2022-03-26 LAB — LIPID PANEL
Chol/HDL Ratio: 3.2 ratio (ref 0.0–5.0)
Cholesterol, Total: 122 mg/dL (ref 100–199)
HDL: 38 mg/dL — ABNORMAL LOW (ref >39)
LDL Chol Calc (NIH): 60 mg/dL (ref 0–99)
Triglycerides: 135 mg/dL (ref 0–149)
VLDL Cholesterol Cal: 24 mg/dL (ref 5–40)

## 2022-03-26 LAB — HEMOGLOBIN A1C
Est. average glucose Bld gHb Est-mCnc: 140 mg/dL
Hgb A1c MFr Bld: 6.5 % — ABNORMAL HIGH (ref 4.8–5.6)

## 2022-03-27 ENCOUNTER — Ambulatory Visit (INDEPENDENT_AMBULATORY_CARE_PROVIDER_SITE_OTHER): Payer: PPO | Admitting: Internal Medicine

## 2022-03-27 ENCOUNTER — Encounter: Payer: Self-pay | Admitting: Internal Medicine

## 2022-03-27 VITALS — BP 124/78 | HR 84 | Ht 68.0 in | Wt 177.4 lb

## 2022-03-27 DIAGNOSIS — E785 Hyperlipidemia, unspecified: Secondary | ICD-10-CM | POA: Diagnosis not present

## 2022-03-27 DIAGNOSIS — E119 Type 2 diabetes mellitus without complications: Secondary | ICD-10-CM | POA: Diagnosis not present

## 2022-03-27 DIAGNOSIS — I10 Essential (primary) hypertension: Secondary | ICD-10-CM

## 2022-03-27 DIAGNOSIS — R195 Other fecal abnormalities: Secondary | ICD-10-CM

## 2022-03-27 DIAGNOSIS — E1169 Type 2 diabetes mellitus with other specified complication: Secondary | ICD-10-CM | POA: Diagnosis not present

## 2022-03-27 NOTE — Progress Notes (Signed)
Established Patient Office Visit  Subjective   Patient ID: Robert Walker, male    DOB: [birth year]  Age: 72 y.o. MRN: [id number]  Chief Complaint  Patient presents with   Diabetes    A1C follow up   Robert Walker returns to care today for follow-up of HTN, DM, and HLD.  He was last seen by me on 12/20/21 at which time we decided that he would focus on lifestyle modifications aimed at improving his A1c and lipid profile before making any additional medication adjustments.  In the interim he has been seen by gastroenterology in the setting of a positive Cologuard screening.  Colonoscopy has been discussed, however they are awaiting records from his previous gastroenterologist in [place].  There have otherwise been no acute interval events.  Robert Walker reports feeling well today.  He is asymptomatic and has no additional concerns to discuss.  Past Medical History:  Diagnosis Date   Alcoholism ([place])    quit in 2022   Depression    Diabetes ([place])    HLD (hyperlipidemia)    Hypertension    Past Surgical History:  Procedure Laterality Date   HERNIA REPAIR     TONSILLECTOMY     age 62   Social History   Tobacco Use   Smoking status: Former    Years: 10.00    Types: Cigarettes    Quit date: 1985    Years since quitting: 39.1    Passive exposure: Past   Smokeless tobacco: Never  Substance Use Topics   Alcohol use: Not Currently   Drug use: Never   History reviewed. No pertinent family history. Allergies  Allergen Reactions   Bee Venom Anaphylaxis   Review of Systems  Constitutional:  Negative for chills and fever.  HENT:  Negative for sore throat.   Respiratory:  Negative for cough and shortness of breath.   Cardiovascular:  Negative for chest pain, palpitations and leg swelling.  Gastrointestinal:  Negative for abdominal pain, blood in stool, constipation, diarrhea, nausea and vomiting.  Genitourinary:  Negative for dysuria and hematuria.  Musculoskeletal:  Negative for myalgias.   Skin:  Negative for itching and rash.  Neurological:  Negative for dizziness and headaches.  Psychiatric/Behavioral:  Negative for depression and suicidal ideas.      Objective:     BP 124/78   Pulse 84   Ht 5' 8"$  (1.727 m)   Wt 177 lb 6.4 oz (80.5 kg)   SpO2 93%   BMI 26.97 kg/m  BP Readings from Last 3 Encounters:  03/27/22 124/78  01/14/22 124/85  12/20/21 (!) 134/90   Physical Exam Vitals reviewed.  Constitutional:      General: He is not in acute distress.    Appearance: Normal appearance. He is not ill-appearing.  HENT:     Head: Normocephalic and atraumatic.     Nose: Nose normal. No congestion or rhinorrhea.     Mouth/Throat:     Mouth: Mucous membranes are moist.     Pharynx: Oropharynx is clear.  Eyes:     Extraocular Movements: Extraocular movements intact.     Conjunctiva/sclera: Conjunctivae normal.     Pupils: Pupils are equal, round, and reactive to light.  Cardiovascular:     Rate and Rhythm: Normal rate and regular rhythm.     Pulses: Normal pulses.     Heart sounds: Murmur heard.  Pulmonary:     Effort: Pulmonary effort is normal.     Breath sounds: Normal breath sounds.  No wheezing, rhonchi or rales.  Abdominal:     General: Abdomen is flat. Bowel sounds are normal. There is no distension.     Palpations: Abdomen is soft.     Tenderness: There is no abdominal tenderness.  Musculoskeletal:        General: No swelling or deformity. Normal range of motion.     Cervical back: Normal range of motion.  Skin:    General: Skin is warm and dry.     Capillary Refill: Capillary refill takes less than 2 seconds.  Neurological:     General: No focal deficit present.     Mental Status: He is alert and oriented to person, place, and time.     Motor: No weakness.  Psychiatric:        Mood and Affect: Mood normal.        Behavior: Behavior normal.        Thought Content: Thought content normal.   Last CBC Lab Results  Component Value Date   WBC 5.9  11/22/2021   HGB 15.4 11/22/2021   HCT 45.5 11/22/2021   MCV 93 11/22/2021   MCH 31.3 11/22/2021   RDW 12.1 11/22/2021   PLT 347 [id number]   Last metabolic panel Lab Results  Component Value Date   GLUCOSE 116 (H) 11/22/2021   NA 135 11/22/2021   K 4.3 11/22/2021   CL 97 11/22/2021   CO2 22 11/22/2021   BUN 12 11/22/2021   CREATININE 0.86 11/22/2021   EGFR 92 11/22/2021   CALCIUM 9.8 11/22/2021   PROT 7.0 11/22/2021   ALBUMIN 4.8 11/22/2021   LABGLOB 2.2 11/22/2021   AGRATIO 2.2 11/22/2021   BILITOT 0.3 11/22/2021   ALKPHOS 52 11/22/2021   AST 37 11/22/2021   ALT 31 11/22/2021   Last lipids Lab Results  Component Value Date   CHOL 122 03/25/2022   HDL 38 (L) 03/25/2022   LDLCALC 60 03/25/2022   TRIG 135 03/25/2022   CHOLHDL 3.2 03/25/2022   Last hemoglobin A1c Lab Results  Component Value Date   HGBA1C 6.5 (H) 03/25/2022   Last thyroid functions Lab Results  Component Value Date   TSH 1.180 05/23/2021   Last vitamin D Lab Results  Component Value Date   VD25OH 34.6 05/23/2021     Assessment & Plan:   Problem List Items Addressed This Visit       Essential hypertension    He is currently prescribed lisinopril-HCTZ 20-12.5 mg daily for treatment of hypertension.  His initial blood pressure today was 146/78 and improved to 124/78 on repeat. -No medication changes today.  BP is adequately controlled.      Hyperlipidemia associated with type 2 diabetes mellitus ([place])    He is currently prescribed simvastatin 40 mg daily.  Lipid panel repeated earlier this week.  Total cholesterol 122 and LDL 60. -No medication changes today.  HLD adequately controlled on simvastatin.      Type 2 diabetes mellitus without complications (HCC) - Primary    A1c has improved to 6.5 today.  He remains diet controlled and has made significant lifestyle modifications since November to improve his A1c to 6.5 from 7.2 previously. -Robert Walker was congratulated on his progress  today.  He will continue to control his diabetes with diet alone. -Urine microalbumin/creatinine ratio ordered today      Positive colorectal cancer screening using Cologuard test    Previously referred to gastroenterology to discuss colonoscopy in the setting of a positive Cologuard test.  He was seen by GI in December.  They are awaiting records from his gastroenterologist in [place].  We will request records again today.       Return in about 6 months (around 09/25/2022).    Johnette Abraham, MD

## 2022-03-27 NOTE — Assessment & Plan Note (Signed)
Previously referred to gastroenterology to discuss colonoscopy in the setting of a positive Cologuard test.  He was seen by GI in December.  They are awaiting records from his gastroenterologist in [place].  We will request records again today.

## 2022-03-27 NOTE — Assessment & Plan Note (Signed)
A1c has improved to 6.5 today.  He remains diet controlled and has made significant lifestyle modifications since November to improve his A1c to 6.5 from 7.2 previously. -Robert Walker was congratulated on his progress today.  He will continue to control his diabetes with diet alone. -Urine microalbumin/creatinine ratio ordered today

## 2022-03-27 NOTE — Assessment & Plan Note (Signed)
He is currently prescribed simvastatin 40 mg daily.  Lipid panel repeated earlier this week.  Total cholesterol 122 and LDL 60. -No medication changes today.  HLD adequately controlled on simvastatin.

## 2022-03-27 NOTE — Assessment & Plan Note (Signed)
He is currently prescribed lisinopril-HCTZ 20-12.5 mg daily for treatment of hypertension.  His initial blood pressure today was 146/78 and improved to 124/78 on repeat. -No medication changes today.  BP is adequately controlled.

## 2022-03-27 NOTE — Patient Instructions (Signed)
It was a pleasure to see you today.  Thank you for giving [place] the opportunity to be involved in your care.  Below is a brief recap of your visit and next steps.  We will plan to see you again in 6 months.  Summary No medication changes today We will plan for follow up in 6 months Urine study ordered today

## 2022-03-29 LAB — MICROALBUMIN / CREATININE URINE RATIO
Creatinine, Urine: 35.9 mg/dL
Microalb/Creat Ratio: 40 mg/g creat — ABNORMAL HIGH (ref 0–29)
Microalbumin, Urine: 14.4 ug/mL

## 2022-04-01 ENCOUNTER — Other Ambulatory Visit: Payer: Self-pay | Admitting: Family Medicine

## 2022-04-01 DIAGNOSIS — E7800 Pure hypercholesterolemia, unspecified: Secondary | ICD-10-CM

## 2022-04-01 DIAGNOSIS — F32A Depression, unspecified: Secondary | ICD-10-CM

## 2022-04-16 DIAGNOSIS — H2513 Age-related nuclear cataract, bilateral: Secondary | ICD-10-CM | POA: Diagnosis not present

## 2022-04-16 DIAGNOSIS — H40003 Preglaucoma, unspecified, bilateral: Secondary | ICD-10-CM | POA: Diagnosis not present

## 2022-05-13 ENCOUNTER — Other Ambulatory Visit: Payer: Self-pay | Admitting: Family Medicine

## 2022-05-13 DIAGNOSIS — E7800 Pure hypercholesterolemia, unspecified: Secondary | ICD-10-CM

## 2022-05-29 ENCOUNTER — Encounter: Payer: Self-pay | Admitting: Internal Medicine

## 2022-05-29 DIAGNOSIS — K5792 Diverticulitis of intestine, part unspecified, without perforation or abscess without bleeding: Secondary | ICD-10-CM

## 2022-05-29 MED ORDER — METRONIDAZOLE 500 MG PO TABS: 500.0000 mg | ORAL_TABLET | Freq: Three times a day (TID) | ORAL | 0 refills | Status: AC

## 2022-05-29 MED ORDER — CIPROFLOXACIN HCL 500 MG PO TABS: 500.0000 mg | ORAL_TABLET | Freq: Two times a day (BID) | ORAL | 0 refills | Status: AC

## 2022-08-10 ENCOUNTER — Encounter: Payer: Self-pay | Admitting: Internal Medicine

## 2022-08-10 DIAGNOSIS — K5792 Diverticulitis of intestine, part unspecified, without perforation or abscess without bleeding: Secondary | ICD-10-CM

## 2022-08-11 MED ORDER — METRONIDAZOLE 250 MG PO TABS: 500.0000 mg | ORAL_TABLET | Freq: Three times a day (TID) | ORAL | 0 refills | Status: AC

## 2022-08-11 MED ORDER — CIPROFLOXACIN HCL 500 MG PO TABS: 500.0000 mg | ORAL_TABLET | Freq: Two times a day (BID) | ORAL | 0 refills | Status: AC

## 2022-09-26 ENCOUNTER — Ambulatory Visit (INDEPENDENT_AMBULATORY_CARE_PROVIDER_SITE_OTHER): Payer: PPO | Admitting: Internal Medicine

## 2022-09-26 ENCOUNTER — Encounter: Payer: Self-pay | Admitting: Internal Medicine

## 2022-09-26 VITALS — BP 138/70 | HR 90 | Ht 68.0 in | Wt 179.2 lb

## 2022-09-26 DIAGNOSIS — E119 Type 2 diabetes mellitus without complications: Secondary | ICD-10-CM | POA: Diagnosis not present

## 2022-09-26 DIAGNOSIS — I10 Essential (primary) hypertension: Secondary | ICD-10-CM | POA: Diagnosis not present

## 2022-09-26 DIAGNOSIS — F32A Depression, unspecified: Secondary | ICD-10-CM

## 2022-09-26 LAB — HEMOGLOBIN A1C: Hemoglobin A1C: 6.7

## 2022-09-26 NOTE — Patient Instructions (Signed)
It was a pleasure to see you today.  Thank you for giving [place] the opportunity to be involved in your care.  Below is a brief recap of your visit and next steps.  We will plan to see you again in 6 months.  Summary No medication changes today Repeat A1c ordered Follow up in 6 months I recommend following up with gastroenterology

## 2022-09-26 NOTE — Progress Notes (Signed)
Established Patient Office Visit  Subjective   Patient ID: LUISMIGUEL Walker, male    DOB: December 11, 1950  Age: 72 y.o. MRN: [id number]  Chief Complaint  Patient presents with   Diabetes    6 month follow up    [name] returns to care today for routine follow-up.  He was last evaluated by me on 2/22.  No medication changes were made at that time and 2-month follow-up was arranged.  There have been no acute interval events.  Mr. Stenglein reports feeling well today.  He is asymptomatic and has no acute concerns or discussed.  Past Medical History:  Diagnosis Date   Alcoholism ([facility])    quit in 2022   Depression    Diabetes ([facility])    HLD (hyperlipidemia)    Hypertension    Past Surgical History:  Procedure Laterality Date   HERNIA REPAIR     TONSILLECTOMY     age 79   Social History   Tobacco Use   Smoking status: Former    Current packs/day: 0.00    Types: Cigarettes    Start date: 29    Quit date: 1985    Years since quitting: 39.6    Passive exposure: Past   Smokeless tobacco: Never  Substance Use Topics   Alcohol use: Not Currently   Drug use: Never   No family history on file. Allergies  Allergen Reactions   Bee Venom Anaphylaxis   Review of Systems  Constitutional:  Negative for chills and fever.  HENT:  Negative for sore throat.   Respiratory:  Negative for cough and shortness of breath.   Cardiovascular:  Negative for chest pain, palpitations and leg swelling.  Gastrointestinal:  Negative for abdominal pain, blood in stool, constipation, diarrhea, nausea and vomiting.  Genitourinary:  Negative for dysuria and hematuria.  Musculoskeletal:  Negative for myalgias.  Skin:  Negative for itching and rash.  Neurological:  Negative for dizziness and headaches.  Psychiatric/Behavioral:  Negative for depression and suicidal ideas.      Objective:     BP 138/70   Pulse 90   Ht 5\' 8"  (1.727 m)   Wt 179 lb 3.2 oz (81.3 kg)   SpO2 94%   BMI 27.25 kg/m  BP Readings from  Last 3 Encounters:  09/26/22 138/70  03/27/22 124/78  01/14/22 124/85   Physical Exam Vitals reviewed.  Constitutional:      General: He is not in acute distress.    Appearance: Normal appearance. He is not ill-appearing.  HENT:     Head: Normocephalic and atraumatic.     Nose: Nose normal. No congestion or rhinorrhea.     Mouth/Throat:     Mouth: Mucous membranes are moist.     Pharynx: Oropharynx is clear.  Eyes:     Extraocular Movements: Extraocular movements intact.     Conjunctiva/sclera: Conjunctivae normal.     Pupils: Pupils are equal, round, and reactive to light.  Cardiovascular:     Rate and Rhythm: Normal rate and regular rhythm.     Pulses: Normal pulses.     Heart sounds: Murmur heard.  Pulmonary:     Effort: Pulmonary effort is normal.     Breath sounds: Normal breath sounds. No wheezing, rhonchi or rales.  Abdominal:     General: Abdomen is flat. Bowel sounds are normal. There is no distension.     Palpations: Abdomen is soft.     Tenderness: There is no abdominal tenderness.  Musculoskeletal:  General: No swelling or deformity. Normal range of motion.     Cervical back: Normal range of motion.  Skin:    General: Skin is warm and dry.     Capillary Refill: Capillary refill takes less than 2 seconds.  Neurological:     General: No focal deficit present.     Mental Status: He is alert and oriented to person, place, and time.     Motor: No weakness.  Psychiatric:        Mood and Affect: Mood normal.        Behavior: Behavior normal.        Thought Content: Thought content normal.   Last CBC Lab Results  Component Value Date   WBC 5.9 11/22/2021   HGB 15.4 11/22/2021   HCT 45.5 11/22/2021   MCV 93 11/22/2021   MCH 31.3 11/22/2021   RDW 12.1 11/22/2021   PLT 347 11/22/2021   Last metabolic panel Lab Results  Component Value Date   GLUCOSE 116 (H) 11/22/2021   NA 135 11/22/2021   K 4.3 11/22/2021   CL 97 11/22/2021   CO2 22 11/22/2021    BUN 12 11/22/2021   CREATININE 0.86 11/22/2021   EGFR 92 11/22/2021   CALCIUM 9.8 11/22/2021   PROT 7.0 11/22/2021   ALBUMIN 4.8 11/22/2021   LABGLOB 2.2 11/22/2021   AGRATIO 2.2 11/22/2021   BILITOT 0.3 11/22/2021   ALKPHOS 52 11/22/2021   AST 37 11/22/2021   ALT 31 11/22/2021   Last lipids Lab Results  Component Value Date   CHOL 122 03/25/2022   HDL 38 (L) 03/25/2022   LDLCALC 60 03/25/2022   TRIG 135 03/25/2022   CHOLHDL 3.2 03/25/2022   Last hemoglobin A1c Lab Results  Component Value Date   HGBA1C 6.7 (H) 09/26/2022   Last thyroid functions Lab Results  Component Value Date   TSH 1.180 05/23/2021   Last vitamin D Lab Results  Component Value Date   VD25OH 34.6 05/23/2021     Assessment & Plan:   Problem List Items Addressed This Visit       Essential hypertension    Remains adequately controlled on current antihypertensive regimen.  No medication changes are indicated today.      Type 2 diabetes mellitus without complications (HCC) - Primary    POC A1c is 6.7.  Diabetes remains adequately controlled with diet. -No medication changes are indicated today. -Diabetic eye exam is scheduled for March 2024      Depression    Mood remains stable with citalopram 20 mg daily.  No medication changes are indicated today.      Return in about 6 months (around 03/29/2023).   Billie Lade, MD

## 2022-09-30 LAB — BAYER DCA HB A1C WAIVED: HB A1C (BAYER DCA - WAIVED): 6.7 % — ABNORMAL HIGH (ref 4.8–5.6)

## 2022-10-06 ENCOUNTER — Encounter: Payer: Self-pay | Admitting: Internal Medicine

## 2022-10-06 NOTE — Assessment & Plan Note (Signed)
 Remains adequately controlled on current antihypertensive regimen.  No medication changes are indicated today.

## 2022-10-06 NOTE — Assessment & Plan Note (Signed)
Mood remains stable with citalopram 20 mg daily.  No medication changes are indicated today.

## 2022-10-06 NOTE — Assessment & Plan Note (Signed)
POC A1c is 6.7.  Diabetes remains adequately controlled with diet. -No medication changes are indicated today. -Diabetic eye exam is scheduled for March 2024

## 2022-10-15 DIAGNOSIS — H40003 Preglaucoma, unspecified, bilateral: Secondary | ICD-10-CM | POA: Diagnosis not present

## 2022-10-15 DIAGNOSIS — H2513 Age-related nuclear cataract, bilateral: Secondary | ICD-10-CM | POA: Diagnosis not present

## 2022-10-29 DIAGNOSIS — H40003 Preglaucoma, unspecified, bilateral: Secondary | ICD-10-CM | POA: Diagnosis not present

## 2022-10-29 DIAGNOSIS — H2513 Age-related nuclear cataract, bilateral: Secondary | ICD-10-CM | POA: Diagnosis not present

## 2022-11-21 DIAGNOSIS — H2512 Age-related nuclear cataract, left eye: Secondary | ICD-10-CM | POA: Diagnosis not present

## 2022-11-24 NOTE — H&P (Signed)
Surgical History & Physical  Patient Name: Robert Walker  DOB: [birth year]  Surgery: Cataract extraction with intraocular lens implant phacoemulsification; Left Eye Surgeon: Pecolia Ades MD Surgery Date: 11/28/2022 Pre-Op Date: 10/29/2022  HPI: A 53 Yr. old male patient 1. The patient complains of sunlight glare/brightness causing poor vision, which began for an unknown amount of time. Both eyes are affected. The episode is constant. The condition's severity is worsening. 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 He is unable to drive currently due to his vision. This is negatively affecting the patient's quality of life and the patient is unable to function adequately in life with the current level of vision. Patient did stop Latanoprost after last visit per Dr. Chaya Jan request.  Medical History: Glaucoma Cataracts  High Blood Pressure LDL  Review of Systems Negative Allergic/Immunologic Hypertensive Cardiovascular Negative Constitutional Negative Ear, Nose, Mouth & Throat Negative Endocrine Negative Eyes Negative Gastrointestinal Negative Genitourinary Negative Hemotologic/Lymphatic Negative Integumentary Negative Musculoskeletal Negative Neurological Negative Psychiatry Negative Respiratory  Social Former smoker of Cigarettes   Medication Lisinopril, Simvastatin,  simvastatin ,  lisinopril-hydrochlorothiazide ,  metronidazole   Sx/Procedures Tonsillectomy, Hernia repair  Drug Allergies  NKDA  History & Physical: Heent: cataracts  NECK: supple without bruits LUNGS: lungs clear to auscultation CV: regular rate and rhythm Abdomen: soft and non-tender  Impression & Plan: Assessment: 1.  CATARACT AGE-RELATED NUCLEAR; Both Eyes (H25.13) 2.  GLAUCOMA SUSPECT; Both Eyes (H40.003) 3.  Hyperopia ; Left Eye (H52.02)  Plan: 1.  Cataracts are visually significant and account for the patient's complaints. Discussed all risks, benefits,  procedures and recovery, including infection, loss of vision and eye, need for glasses after surgery or additional procedures. Patient understands changing glasses will not improve vision. Patient indicated understanding of procedure. All questions answered. Patient desires to have surgery, recommend phacoemulsification with intraocular lens. Patient to have preliminary testing necessary ([place]/IOL Master, Mac OCT, TOPO) Educational materials provided:Cataract  Plan: - Proceed with cataract surgery OS followed by OD - DIB00 with best distance vision OU - Very bothered by glare with blue iris, no flomax, but care with main incision - Ok with lying flat on back - No DM, No prior eye surgeries  2.  Has been on Latanoprost for past 6 years, initially followed in [place]. He thinks his highest IOP was in the high teens. Denies FHx of glaucoma. All testing today looks good, HVF full.   Plan: IOP remains in teens without drops. Will monitor off of drops  Gonio: open throughout Pachy 532/539 Tmax: 18/17 Disc appearance: 0.4/0.35  OCT RNFL 04/16/22 OD: ave thickness 88, borderline inferiorly, otherwise full OS: ave thickness 88, full on quadrant  HVF 10/15/22 OD: full throughout OS: full throughout  3.  Per Dr. Charise Killian

## 2022-11-25 ENCOUNTER — Ambulatory Visit (INDEPENDENT_AMBULATORY_CARE_PROVIDER_SITE_OTHER): Payer: PPO

## 2022-11-25 DIAGNOSIS — Z23 Encounter for immunization: Secondary | ICD-10-CM | POA: Diagnosis not present

## 2022-11-25 NOTE — Progress Notes (Signed)
Shot received without complication

## 2022-11-26 ENCOUNTER — Encounter (HOSPITAL_COMMUNITY)
Admission: RE | Admit: 2022-11-26 | Discharge: 2022-11-26 | Disposition: A | Discharge status: Home / Self Care | Payer: PPO | Source: Ambulatory Visit | Attending: Optometry | Admitting: Optometry

## 2022-11-28 ENCOUNTER — Encounter (HOSPITAL_COMMUNITY): Payer: Self-pay | Admitting: Optometry

## 2022-11-28 ENCOUNTER — Encounter (HOSPITAL_COMMUNITY)
Admission: RE | Disposition: A | Discharge status: Home / Self Care | Payer: Self-pay | Source: Home / Self Care | Attending: Optometry

## 2022-11-28 ENCOUNTER — Ambulatory Visit (HOSPITAL_COMMUNITY): Payer: PPO | Admitting: Anesthesiology

## 2022-11-28 ENCOUNTER — Ambulatory Visit (HOSPITAL_COMMUNITY)
Admission: RE | Admit: 2022-11-28 | Discharge: 2022-11-28 | Disposition: A | Discharge status: Home / Self Care | Payer: PPO | Attending: Optometry | Admitting: Optometry

## 2022-11-28 DIAGNOSIS — Z87891 Personal history of nicotine dependence: Secondary | ICD-10-CM | POA: Insufficient documentation

## 2022-11-28 DIAGNOSIS — H2512 Age-related nuclear cataract, left eye: Secondary | ICD-10-CM | POA: Insufficient documentation

## 2022-11-28 DIAGNOSIS — H5202 Hypermetropia, left eye: Secondary | ICD-10-CM | POA: Insufficient documentation

## 2022-11-28 DIAGNOSIS — E1136 Type 2 diabetes mellitus with diabetic cataract: Secondary | ICD-10-CM

## 2022-11-28 DIAGNOSIS — E119 Type 2 diabetes mellitus without complications: Secondary | ICD-10-CM

## 2022-11-28 DIAGNOSIS — I10 Essential (primary) hypertension: Secondary | ICD-10-CM | POA: Insufficient documentation

## 2022-11-28 HISTORY — PX: CATARACT EXTRACTION W/PHACO: SHX586

## 2022-11-28 LAB — GLUCOSE, CAPILLARY: Glucose-Capillary: 112 mg/dL — ABNORMAL HIGH (ref 70–99)

## 2022-11-28 SURGERY — PHACOEMULSIFICATION, CATARACT, WITH IOL INSERTION
Anesthesia: Monitor Anesthesia Care | Site: Eye | Laterality: Left

## 2022-11-28 MED ORDER — PHENYLEPHRINE-KETOROLAC 1-0.3 % IO SOLN
INTRAOCULAR | Status: DC | PRN
  Administered 2022-11-28: 500 mL via OPHTHALMIC

## 2022-11-28 MED ORDER — FENTANYL CITRATE (PF) 100 MCG/2ML IJ SOLN
INTRAMUSCULAR | Status: AC
  Filled 2022-11-28: qty 2

## 2022-11-28 MED ORDER — SODIUM CHLORIDE 0.9% FLUSH
INTRAVENOUS | Status: DC | PRN
  Administered 2022-11-28 (×3): 5 mL via INTRAVENOUS

## 2022-11-28 MED ORDER — PHENYLEPHRINE-KETOROLAC 1-0.3 % IO SOLN
INTRAOCULAR | Status: AC
  Filled 2022-11-28: qty 4

## 2022-11-28 MED ORDER — STERILE WATER FOR IRRIGATION IR SOLN
Status: DC | PRN
  Administered 2022-11-28: 250 mL

## 2022-11-28 MED ORDER — LIDOCAINE HCL (PF) 1 % IJ SOLN
INTRAMUSCULAR | Status: DC | PRN
  Administered 2022-11-28: 2 mL

## 2022-11-28 MED ORDER — TETRACAINE HCL 0.5 % OP SOLN
1.0000 [drp] | OPHTHALMIC | Status: AC | PRN
  Administered 2022-11-28 (×3): 1 [drp] via OPHTHALMIC

## 2022-11-28 MED ORDER — TROPICAMIDE 1 % OP SOLN
1.0000 [drp] | OPHTHALMIC | Status: AC | PRN
  Administered 2022-11-28 (×3): 1 [drp] via OPHTHALMIC

## 2022-11-28 MED ORDER — PHENYLEPHRINE HCL 2.5 % OP SOLN
1.0000 [drp] | OPHTHALMIC | Status: AC | PRN
  Administered 2022-11-28 (×3): 1 [drp] via OPHTHALMIC

## 2022-11-28 MED ORDER — MIDAZOLAM HCL 2 MG/2ML IJ SOLN
INTRAMUSCULAR | Status: AC
  Filled 2022-11-28: qty 2

## 2022-11-28 MED ORDER — FENTANYL CITRATE (PF) 100 MCG/2ML IJ SOLN
INTRAMUSCULAR | Status: DC | PRN
  Administered 2022-11-28 (×2): 50 ug via INTRAVENOUS

## 2022-11-28 MED ORDER — POVIDONE-IODINE 5 % OP SOLN
OPHTHALMIC | Status: DC | PRN
  Administered 2022-11-28: 1 via OPHTHALMIC

## 2022-11-28 MED ORDER — MOXIFLOXACIN HCL 5 MG/ML IO SOLN
INTRAOCULAR | Status: DC | PRN
  Administered 2022-11-28: .2 mL via INTRACAMERAL

## 2022-11-28 MED ORDER — MIDAZOLAM HCL 2 MG/2ML IJ SOLN
INTRAMUSCULAR | Status: DC | PRN
  Administered 2022-11-28: 2 mg via INTRAVENOUS

## 2022-11-28 MED ORDER — SIGHTPATH DOSE#1 NA HYALUR & NA CHOND-NA HYALUR IO KIT
PACK | INTRAOCULAR | Status: DC | PRN
  Administered 2022-11-28: 1 via OPHTHALMIC

## 2022-11-28 MED ORDER — BSS IO SOLN
INTRAOCULAR | Status: DC | PRN
  Administered 2022-11-28: 15 mL via INTRAOCULAR

## 2022-11-28 MED ORDER — LIDOCAINE HCL 3.5 % OP GEL
1.0000 | Freq: Once | OPHTHALMIC | Status: AC
  Administered 2022-11-28: 1 via OPHTHALMIC

## 2022-11-28 SURGICAL SUPPLY — 15 items
CATARACT SUITE SIGHTPATH (MISCELLANEOUS) ×1
CLOTH BEACON ORANGE TIMEOUT ST (SAFETY) ×1 IMPLANT
DRSG TEGADERM 4X4.75 (GAUZE/BANDAGES/DRESSINGS) ×1 IMPLANT
EYE SHIELD UNIVERSAL CLEAR (GAUZE/BANDAGES/DRESSINGS) IMPLANT
FEE CATARACT SUITE SIGHTPATH (MISCELLANEOUS) ×1 IMPLANT
GLOVE BIOGEL PI IND STRL 7.0 (GLOVE) ×2 IMPLANT
LENS IOL TECNIS EYHANCE 19.5 (Intraocular Lens) IMPLANT
NDL HYPO 18GX1.5 BLUNT FILL (NEEDLE) ×1 IMPLANT
NEEDLE HYPO 18GX1.5 BLUNT FILL (NEEDLE) ×1
PAD ARMBOARD 7.5X6 YLW CONV (MISCELLANEOUS) ×1 IMPLANT
POSITIONER HEAD 8X9X4 ADT (SOFTGOODS) ×1 IMPLANT
RING MALYGIN 7.0 (MISCELLANEOUS) IMPLANT
SYR TB 1ML LL NO SAFETY (SYRINGE) ×1 IMPLANT
TAPE SURG TRANSPORE 1 IN (GAUZE/BANDAGES/DRESSINGS) IMPLANT
WATER STERILE IRR 250ML POUR (IV SOLUTION) ×1 IMPLANT

## 2022-11-28 NOTE — Discharge Instructions (Signed)
Please discharge patient when stable, will follow up today with Dr. Tod Abrahamsen at the [facility] office immediately following discharge.  Leave shield in place until visit.  All paperwork with discharge instructions will be given at the office.  [facility] Address:  [address]  [place], [place] [address]  Dr. Feliciano Wynter's Phone: [phone]  

## 2022-11-28 NOTE — Addendum Note (Signed)
Addendum  created 11/28/22 1248 by Oletha Cruel, CRNA   Clinical Note Signed

## 2022-11-28 NOTE — Anesthesia Postprocedure Evaluation (Signed)
Anesthesia Post Note  Patient: Robert Walker  Procedure(s) Performed: CATARACT EXTRACTION PHACO AND INTRAOCULAR LENS PLACEMENT (IOC) (Left: Eye)  Patient location during evaluation: PACU Anesthesia Type: MAC Level of consciousness: awake and alert Pain management: pain level controlled Vital Signs Assessment: post-procedure vital signs reviewed and stable Respiratory status: spontaneous breathing, nonlabored ventilation, respiratory function stable and patient connected to nasal cannula oxygen Cardiovascular status: stable and blood pressure returned to baseline Postop Assessment: no apparent nausea or vomiting Anesthetic complications: no   There were no known notable events for this encounter.   Last Vitals:  Vitals:   11/28/22 1055 11/28/22 1151  BP: (!) 156/95 131/82  Pulse: 95 95  Resp: 20 18  Temp: 36.8 C 36.7 C  SpO2: 97% 95%    Last Pain:  Vitals:   11/28/22 1151  TempSrc: Oral  PainSc: 0-No pain                 Meldon Hanzlik L Aadi Bordner

## 2022-11-28 NOTE — Op Note (Signed)
Date of procedure: 11/28/22  Pre-operative diagnosis: Visually significant age-related nuclear cataract, Left Eye (H25.12)  Post-operative diagnosis: Visually significant age-related nuclear cataract, Left Eye  Procedure: Removal of cataract via phacoemulsification and insertion of intra-ocular lens J&J DIB00 +19.5D into the capsular bag of the Left Eye  Attending surgeon: Ronal Fear, MD  Anesthesia: MAC, Topical Akten  Complications: None  Estimated Blood Loss: <52mL (minimal)  Specimens: None  Implants:  Implant Name Type Inv. Item Serial No. Manufacturer Lot No. LRB No. Used Action  LENS IOL TECNIS EYHANCE 19.5 - [id number] Intraocular Lens LENS IOL TECNIS EYHANCE 19.5 [id number] [employer]  Left 1 Implanted    Indications:  Visually significant age-related cataract, Left Eye  Procedure:  The patient was seen and identified in the pre-operative area. The operative eye was identified and dilated.  The operative eye was marked.  Topical anesthesia was administered to the operative eye.     The patient was then to the operative suite and placed in the supine position.  A timeout was performed confirming the patient, procedure to be performed, and all other relevant information.   The patient's face was prepped and draped in the usual fashion for intra-ocular surgery.  A lid speculum was placed into the operative eye and the surgical microscope moved into place and focused.  An inferotemporal paracentesis was created using a 20 gauge paracentesis blade.  BSS mixed with Omidria, followed by 1% lidocaine was injected into the anterior chamber.  Viscoelastic was injected into the anterior chamber.  A temporal clear-corneal main wound incision was created using a 2.47mm microkeratome.  A continuous curvilinear capsulorrhexis was initiated using an irrigating cystitome and completed using capsulorrhexis forceps.  Hydrodissection and hydrodeliniation were performed.  Viscoelastic was  injected into the anterior chamber.  A phacoemulsification handpiece and a chopper as a second instrument were used to remove the nucleus and epinucleus. The irrigation/aspiration handpiece was used to remove any remaining cortical material.   The capsular bag was reinflated with viscoelastic, checked, and found to be intact.  The intraocular lens was inserted into the capsular bag.  The irrigation/aspiration handpiece was used to remove any remaining viscoelastic.  The clear corneal wound and paracentesis wounds were then hydrated and checked with Weck-Cels to be watertight. Moxifloxacin was instilled into the anterior chamber.  The lid-speculum and drape was removed, and the patient's face was cleaned with a wet and dry 4x4.  A clear shield was taped over the eye. The patient was taken to the post-operative care unit in good condition, having tolerated the procedure well.  Post-Op Instructions: The patient will follow up at [facility] for a same day post-operative evaluation and will receive all other orders and instructions.

## 2022-11-28 NOTE — Anesthesia Preprocedure Evaluation (Addendum)
Anesthesia Evaluation  Patient identified by MRN, date of birth, ID band Patient awake    Reviewed: Allergy & Precautions, H&P , NPO status , Patient's Chart, lab work & pertinent test results, reviewed documented beta blocker date and time   Airway Mallampati: II  TM Distance: >3 FB Neck ROM: full    Dental no notable dental hx. (+) Dental Advisory Given, Teeth Intact, Caps Many crowns on back molars:   Pulmonary former smoker   Pulmonary exam normal breath sounds clear to auscultation       Cardiovascular Exercise Tolerance: Good hypertension,  Rhythm:regular Rate:Normal     Neuro/Psych  PSYCHIATRIC DISORDERS  Depression    negative neurological ROS     GI/Hepatic negative GI ROS,,,(+)     substance abuse  alcohol use  Endo/Other  diabetes, Type 2    Renal/GU negative Renal ROS  negative genitourinary   Musculoskeletal negative musculoskeletal ROS (+)    Abdominal Normal abdominal exam  (+)   Peds negative pediatric ROS (+)  Hematology negative hematology ROS (+)   Anesthesia Other Findings   Reproductive/Obstetrics negative OB ROS                             Anesthesia Physical Anesthesia Plan  ASA: 3  Anesthesia Plan: MAC   Post-op Pain Management: Minimal or no pain anticipated   Induction:   PONV Risk Score and Plan:   Airway Management Planned: Nasal Cannula and Natural Airway  Additional Equipment: None  Intra-op Plan:   Post-operative Plan:   Informed Consent: I have reviewed the patients History and Physical, chart, labs and discussed the procedure including the risks, benefits and alternatives for the proposed anesthesia with the patient or authorized representative who has indicated his/her understanding and acceptance.     Dental Advisory Given  Plan Discussed with: CRNA  Anesthesia Plan Comments:         Anesthesia Quick Evaluation

## 2022-11-28 NOTE — Transfer of Care (Addendum)
Immediate Anesthesia Transfer of Care Note  Patient: Robert Walker  Procedure(s) Performed: CATARACT EXTRACTION PHACO AND INTRAOCULAR LENS PLACEMENT (IOC) (Left: Eye)  Patient Location: Short Stay  Anesthesia Type:MAC  Level of Consciousness: awake and patient cooperative  Airway & Oxygen Therapy: Patient Spontanous Breathing  Post-op Assessment: Report given to RN and Post -op Vital signs reviewed and stable  Post vital signs: Reviewed and stable  Last Vitals:  Vitals Value Taken Time  BP 131/82 11/28/22   1151  Temp 36.7 11/28/22   1151  Pulse 92 11/28/22   1151  Resp 18 11/28/22   1151  SpO2 95% 11/28/22   1151    Last Pain:  Vitals:   11/28/22 1053  PainSc: 0-No pain         Complications: No notable events documented.

## 2022-12-03 ENCOUNTER — Encounter (HOSPITAL_COMMUNITY): Payer: Self-pay | Admitting: Optometry

## 2022-12-05 DIAGNOSIS — H2511 Age-related nuclear cataract, right eye: Secondary | ICD-10-CM | POA: Diagnosis not present

## 2022-12-09 ENCOUNTER — Encounter (HOSPITAL_COMMUNITY): Payer: Self-pay

## 2022-12-09 ENCOUNTER — Encounter (HOSPITAL_COMMUNITY)
Admission: RE | Admit: 2022-12-09 | Discharge: 2022-12-09 | Disposition: A | Discharge status: Home / Self Care | Payer: PPO | Source: Ambulatory Visit | Attending: Optometry | Admitting: Optometry

## 2022-12-11 NOTE — H&P (Signed)
Surgical History & Physical  Patient Name: Robert Walker  DOB: August 22, 1950  Surgery: Cataract extraction with intraocular lens implant phacoemulsification; Right Eye Surgeon: Pecolia Ades MD Surgery Date: 12/12/2022 Pre-Op Date: 12/02/2022  HPI: A 63 Yr. old male patient present for 4 day post op OS. Patient states some FB sensation, will see a light on occasion flash when he blinks his eye OS. Sees better with glasses on. Having glare problems on bright sunny days. Seeing some streaking from overhead lights periodically. Patient is a Occupational hygienist and would like to proceed with cataract surgery due to the glare problems.  Medical History: Glaucoma Cataracts  High Blood Pressure LDL  Review of Systems Negative Allergic/Immunologic Cardiovascular Hypertension  Negative Constitutional Negative Ear, Nose, Mouth & Throat Negative Endocrine Eyes cataracts Negative Gastrointestinal Negative Genitourinary Negative Hemotologic/Lymphatic Negative Integumentary Negative Musculoskeletal Negative Neurological Negative Psychiatry Negative Respiratory  Social Former smoker of Cigarettes   Medication Ciprofloxacin hcl, Prednisolone sod ph-bromf,  Lisinopril, Simvastatin,  simvastatin ,  lisinopril-hydrochlorothiazide ,  metronidazole ,  brimonidine   Sx/Procedures Phaco c IOL OS,  Tonsillectomy, Hernia repair  Drug Allergies  NKDA  History & Physical: Heent: cataract NECK: supple without bruits LUNGS: lungs clear to auscultation CV: regular rate and rhythm Abdomen: soft and non-tender  Impression & Plan: Assessment: 1.  CATARACT EXTRACTION STATUS; Left Eye (Z98.42) 2.  INTRAOCULAR LENS IOL (Z96.1)  Plan: 1.  POD#4 Continue with eye drops as directed. Reviewed all post-op precautions. Call with increased redness, swelling, pain or loss of vision. Avoid swimming pools and hot tubs for 1 week.  IOP in mid to upper 20s. Will add Brim BID OS.  2.  Doing well since surgery

## 2022-12-12 ENCOUNTER — Ambulatory Visit (HOSPITAL_BASED_OUTPATIENT_CLINIC_OR_DEPARTMENT_OTHER): Payer: PPO | Admitting: Certified Registered"

## 2022-12-12 ENCOUNTER — Ambulatory Visit (HOSPITAL_COMMUNITY)
Admission: RE | Admit: 2022-12-12 | Discharge: 2022-12-12 | Disposition: A | Discharge status: Home / Self Care | Payer: PPO | Attending: Optometry | Admitting: Optometry

## 2022-12-12 ENCOUNTER — Encounter (HOSPITAL_COMMUNITY)
Admission: RE | Disposition: A | Discharge status: Home / Self Care | Payer: Self-pay | Source: Home / Self Care | Attending: Optometry

## 2022-12-12 ENCOUNTER — Encounter (HOSPITAL_COMMUNITY): Payer: Self-pay | Admitting: Optometry

## 2022-12-12 ENCOUNTER — Other Ambulatory Visit: Payer: Self-pay

## 2022-12-12 ENCOUNTER — Ambulatory Visit (HOSPITAL_COMMUNITY): Payer: PPO | Admitting: Certified Registered"

## 2022-12-12 DIAGNOSIS — Z87891 Personal history of nicotine dependence: Secondary | ICD-10-CM | POA: Diagnosis not present

## 2022-12-12 DIAGNOSIS — Z961 Presence of intraocular lens: Secondary | ICD-10-CM | POA: Insufficient documentation

## 2022-12-12 DIAGNOSIS — Z9842 Cataract extraction status, left eye: Secondary | ICD-10-CM | POA: Diagnosis not present

## 2022-12-12 DIAGNOSIS — H2511 Age-related nuclear cataract, right eye: Secondary | ICD-10-CM | POA: Insufficient documentation

## 2022-12-12 DIAGNOSIS — E1136 Type 2 diabetes mellitus with diabetic cataract: Secondary | ICD-10-CM | POA: Diagnosis not present

## 2022-12-12 DIAGNOSIS — I10 Essential (primary) hypertension: Secondary | ICD-10-CM | POA: Diagnosis not present

## 2022-12-12 HISTORY — PX: CATARACT EXTRACTION W/PHACO: SHX586

## 2022-12-12 SURGERY — PHACOEMULSIFICATION, CATARACT, WITH IOL INSERTION
Anesthesia: Monitor Anesthesia Care | Site: Eye | Laterality: Right

## 2022-12-12 MED ORDER — PHENYLEPHRINE HCL 2.5 % OP SOLN
1.0000 [drp] | OPHTHALMIC | Status: AC
  Administered 2022-12-12 (×3): 1 [drp] via OPHTHALMIC

## 2022-12-12 MED ORDER — LIDOCAINE HCL 3.5 % OP GEL
1.0000 | Freq: Once | OPHTHALMIC | Status: AC
  Administered 2022-12-12: 1 via OPHTHALMIC

## 2022-12-12 MED ORDER — LIDOCAINE HCL (PF) 1 % IJ SOLN
INTRAMUSCULAR | Status: DC | PRN
  Administered 2022-12-12: 1 mL

## 2022-12-12 MED ORDER — BSS IO SOLN
INTRAOCULAR | Status: DC | PRN
  Administered 2022-12-12: 15 mL via INTRAOCULAR

## 2022-12-12 MED ORDER — TROPICAMIDE 1 % OP SOLN
1.0000 [drp] | OPHTHALMIC | Status: AC
  Administered 2022-12-12 (×3): 1 [drp] via OPHTHALMIC

## 2022-12-12 MED ORDER — MIDAZOLAM HCL 2 MG/2ML IJ SOLN
INTRAMUSCULAR | Status: DC | PRN
  Administered 2022-12-12: 2 mg via INTRAVENOUS

## 2022-12-12 MED ORDER — TETRACAINE HCL 0.5 % OP SOLN
1.0000 [drp] | OPHTHALMIC | Status: AC
  Administered 2022-12-12 (×3): 1 [drp] via OPHTHALMIC

## 2022-12-12 MED ORDER — SODIUM CHLORIDE 0.9% FLUSH
INTRAVENOUS | Status: DC | PRN
  Administered 2022-12-12 (×2): 5 mL via INTRAVENOUS

## 2022-12-12 MED ORDER — POVIDONE-IODINE 5 % OP SOLN
OPHTHALMIC | Status: DC | PRN
  Administered 2022-12-12: 1 via OPHTHALMIC

## 2022-12-12 MED ORDER — MOXIFLOXACIN HCL 5 MG/ML IO SOLN
INTRAOCULAR | Status: DC | PRN
  Administered 2022-12-12: .3 mL via INTRACAMERAL

## 2022-12-12 MED ORDER — MIDAZOLAM HCL 2 MG/2ML IJ SOLN
INTRAMUSCULAR | Status: AC
  Filled 2022-12-12: qty 2

## 2022-12-12 MED ORDER — SIGHTPATH DOSE#1 NA HYALUR & NA CHOND-NA HYALUR IO KIT
PACK | INTRAOCULAR | Status: DC | PRN
  Administered 2022-12-12: 1 via OPHTHALMIC

## 2022-12-12 MED ORDER — STERILE WATER FOR IRRIGATION IR SOLN
Status: DC | PRN
  Administered 2022-12-12: 1

## 2022-12-12 MED ORDER — FENTANYL CITRATE (PF) 100 MCG/2ML IJ SOLN
INTRAMUSCULAR | Status: AC
  Filled 2022-12-12: qty 2

## 2022-12-12 MED ORDER — PHENYLEPHRINE-KETOROLAC 1-0.3 % IO SOLN
INTRAOCULAR | Status: DC | PRN
  Administered 2022-12-12: 500 mL via OPHTHALMIC

## 2022-12-12 MED ORDER — FENTANYL CITRATE (PF) 100 MCG/2ML IJ SOLN
INTRAMUSCULAR | Status: DC | PRN
  Administered 2022-12-12 (×2): 50 ug via INTRAVENOUS

## 2022-12-12 SURGICAL SUPPLY — 15 items
CATARACT SUITE SIGHTPATH (MISCELLANEOUS) ×1
CLOTH BEACON ORANGE TIMEOUT ST (SAFETY) ×1 IMPLANT
DRSG TEGADERM 4X4.75 (GAUZE/BANDAGES/DRESSINGS) ×1 IMPLANT
EYE SHIELD UNIVERSAL CLEAR (GAUZE/BANDAGES/DRESSINGS) IMPLANT
FEE CATARACT SUITE SIGHTPATH (MISCELLANEOUS) ×1 IMPLANT
GLOVE BIOGEL PI IND STRL 7.0 (GLOVE) ×2 IMPLANT
KIT SLEEVE INFUSION .9 MICRO (MISCELLANEOUS) IMPLANT
LENS IOL TECNIS EYHANCE 19.5 (Intraocular Lens) IMPLANT
NDL HYPO 18GX1.5 BLUNT FILL (NEEDLE) ×1 IMPLANT
NEEDLE HYPO 18GX1.5 BLUNT FILL (NEEDLE) ×1
PAD ARMBOARD 7.5X6 YLW CONV (MISCELLANEOUS) ×1 IMPLANT
POSITIONER HEAD 8X9X4 ADT (SOFTGOODS) ×1 IMPLANT
SYR TB 1ML LL NO SAFETY (SYRINGE) ×1 IMPLANT
TAPE SURG TRANSPORE 1 IN (GAUZE/BANDAGES/DRESSINGS) IMPLANT
WATER STERILE IRR 250ML POUR (IV SOLUTION) ×1 IMPLANT

## 2022-12-12 NOTE — Transfer of Care (Signed)
Immediate Anesthesia Transfer of Care Note  Patient: Robert Walker  Procedure(s) Performed: CATARACT EXTRACTION PHACO AND INTRAOCULAR LENS PLACEMENT (IOC) (Right: Eye)  Patient Location: Short Stay  Anesthesia Type:General  Level of Consciousness: awake, alert , and oriented  Airway & Oxygen Therapy: Patient Spontanous Breathing  Post-op Assessment: Report given to RN and Post -op Vital signs reviewed and stable  Post vital signs: Reviewed and stable  Last Vitals:  Vitals Value Taken Time  BP 134/79 12/12/22 0839  Temp 36.7 C 12/12/22 0839  Pulse 81 12/12/22 0839  Resp 16 12/12/22 0839  SpO2 96 % 12/12/22 0839    Last Pain:  Vitals:   12/12/22 0839  TempSrc: Oral  PainSc: 0-No pain         Complications: No notable events documented.

## 2022-12-12 NOTE — Anesthesia Procedure Notes (Signed)
Procedure Name: MAC Date/Time: 12/12/2022 8:18 AM  Performed by: Julian Reil, CRNAPre-anesthesia Checklist: Patient identified, Emergency Drugs available, Suction available and Patient being monitored Patient Re-evaluated:Patient Re-evaluated prior to induction Oxygen Delivery Method: Nasal cannula Placement Confirmation: positive ETCO2

## 2022-12-12 NOTE — Interval H&P Note (Signed)
History and Physical Interval Note:  12/12/2022 7:24 AM  The H and P was reviewed and updated. The patient was examined.  No changes were found after exam.  The surgical eye was marked.  Wynell Halberg

## 2022-12-12 NOTE — Anesthesia Postprocedure Evaluation (Signed)
Anesthesia Post Note  Patient: Robert Walker  Procedure(s) Performed: CATARACT EXTRACTION PHACO AND INTRAOCULAR LENS PLACEMENT (IOC) (Right: Eye)  Patient location during evaluation: Phase II Anesthesia Type: MAC Level of consciousness: awake Pain management: pain level controlled Vital Signs Assessment: post-procedure vital signs reviewed and stable Respiratory status: spontaneous breathing and respiratory function stable Cardiovascular status: blood pressure returned to baseline and stable Postop Assessment: no headache and no apparent nausea or vomiting Anesthetic complications: no Comments: Late entry   No notable events documented.   Last Vitals:  Vitals:   12/12/22 0659 12/12/22 0839  BP: (!) 158/81 134/79  Pulse: 85 81  Resp: 16 16  Temp: 36.7 C 36.7 C  SpO2: 97% 96%    Last Pain:  Vitals:   12/12/22 0839  TempSrc: Oral  PainSc: 0-No pain                 Windell Norfolk

## 2022-12-12 NOTE — Discharge Instructions (Signed)
Please discharge patient when stable, will follow up today with Dr. Tod Abrahamsen at the [facility] office immediately following discharge.  Leave shield in place until visit.  All paperwork with discharge instructions will be given at the office.  [facility] Address:  [address]  [place], [place] [address]  Dr. Feliciano Wynter's Phone: [phone]  

## 2022-12-12 NOTE — Op Note (Signed)
Date of procedure: 12/12/22  Pre-operative diagnosis: Visually significant age-related nuclear cataract, Right Eye (H25.11)  Post-operative diagnosis: Visually significant age-related nuclear cataract, Right Eye  Procedure: Removal of cataract via phacoemulsification and insertion of intra-ocular lens J&J DIBOO +19.5D into the capsular bag of the Right Eye  Attending surgeon: Pecolia Ades, MD  Anesthesia: MAC, Topical Akten  Complications: None  Estimated Blood Loss: <5mL (minimal)  Specimens: None  Implants:  Implant Name Type Inv. Item Serial No. Manufacturer Lot No. LRB No. Used Action  LENS IOL TECNIS EYHANCE 19.5 - [id number] Intraocular Lens LENS IOL TECNIS EYHANCE 19.5 [id number] [employer]  Right 1 Implanted    Indications:  Visually significant age-related cataract, Right Eye  Procedure:  The patient was seen and identified in the pre-operative area. The operative eye was identified and dilated.  The operative eye was marked.  Topical anesthesia was administered to the operative eye.     The patient was then to the operative suite and placed in the supine position.  A timeout was performed confirming the patient, procedure to be performed, and all other relevant information.   The patient's face was prepped and draped in the usual fashion for intra-ocular surgery.  A lid speculum was placed into the operative eye and the surgical microscope moved into place and focused.  A superotemporal paracentesis was created using a 20 gauge paracentesis blade.  BSS mixed with Omidria, followed by 1% lidocaine was injected into the anterior chamber.  Viscoelastic was injected into the anterior chamber.  A temporal clear-corneal main wound incision was created using a 2.64mm microkeratome.  A continuous curvilinear capsulorrhexis was initiated using an irrigating cystitome and completed using capsulorrhexis forceps.  Hydrodissection and hydrodeliniation were performed.  Viscoelastic was  injected into the anterior chamber.  A phacoemulsification handpiece and a chopper as a second instrument were used to remove the nucleus and epinucleus. The irrigation/aspiration handpiece was used to remove any remaining cortical material.   The capsular bag was reinflated with viscoelastic, checked, and found to be intact.  The intraocular lens was inserted into the capsular bag.  The irrigation/aspiration handpiece was used to remove any remaining viscoelastic.  The clear corneal wound and paracentesis wounds were then hydrated and checked with Weck-Cels to be watertight. Moxifloxacin was instilled into the anterior chamber.  The lid-speculum and drape was removed, and the patient's face was cleaned with a wet and dry 4x4. A clear shield was taped over the eye. The patient was taken to the post-operative care unit in good condition, having tolerated the procedure well.  Post-Op Instructions: The patient will follow up at [facility] for a same day post-operative evaluation and will receive all other orders and instructions.

## 2022-12-12 NOTE — Anesthesia Preprocedure Evaluation (Signed)
 Anesthesia Evaluation  Patient identified by MRN, date of birth, ID band Patient awake    Reviewed: Allergy & Precautions, H&P , NPO status , Patient's Chart, lab work & pertinent test results, reviewed documented beta blocker date and time   Airway Mallampati: II  TM Distance: >3 FB Neck ROM: full    Dental no notable dental hx. (+) Dental Advisory Given, Teeth Intact, Caps Many crowns on back molars:   Pulmonary former smoker   Pulmonary exam normal breath sounds clear to auscultation       Cardiovascular Exercise Tolerance: Good hypertension,  Rhythm:regular Rate:Normal     Neuro/Psych  PSYCHIATRIC DISORDERS  Depression    negative neurological ROS     GI/Hepatic negative GI ROS,,,(+)     substance abuse  alcohol use  Endo/Other  diabetes, Type 2    Renal/GU negative Renal ROS  negative genitourinary   Musculoskeletal negative musculoskeletal ROS (+)    Abdominal Normal abdominal exam  (+)   Peds negative pediatric ROS (+)  Hematology negative hematology ROS (+)   Anesthesia Other Findings   Reproductive/Obstetrics negative OB ROS                             Anesthesia Physical Anesthesia Plan  ASA: 3  Anesthesia Plan: MAC   Post-op Pain Management: Minimal or no pain anticipated   Induction:   PONV Risk Score and Plan:   Airway Management Planned: Nasal Cannula and Natural Airway  Additional Equipment: None  Intra-op Plan:   Post-operative Plan:   Informed Consent: I have reviewed the patients History and Physical, chart, labs and discussed the procedure including the risks, benefits and alternatives for the proposed anesthesia with the patient or authorized representative who has indicated his/her understanding and acceptance.     Dental Advisory Given  Plan Discussed with: CRNA  Anesthesia Plan Comments:         Anesthesia Quick Evaluation

## 2022-12-15 ENCOUNTER — Encounter (HOSPITAL_COMMUNITY): Payer: Self-pay | Admitting: Optometry

## 2023-01-06 DIAGNOSIS — Z9841 Cataract extraction status, right eye: Secondary | ICD-10-CM | POA: Diagnosis not present

## 2023-01-06 DIAGNOSIS — H5211 Myopia, right eye: Secondary | ICD-10-CM | POA: Diagnosis not present

## 2023-01-06 DIAGNOSIS — H524 Presbyopia: Secondary | ICD-10-CM | POA: Diagnosis not present

## 2023-01-06 DIAGNOSIS — Z9842 Cataract extraction status, left eye: Secondary | ICD-10-CM | POA: Diagnosis not present

## 2023-01-06 DIAGNOSIS — Z961 Presence of intraocular lens: Secondary | ICD-10-CM | POA: Diagnosis not present

## 2023-01-22 ENCOUNTER — Encounter: Payer: Self-pay | Admitting: Internal Medicine

## 2023-01-22 NOTE — Telephone Encounter (Signed)
 Care team updated and letter sent for eye exam notes.

## 2023-02-24 ENCOUNTER — Telehealth: Payer: Self-pay | Admitting: Internal Medicine

## 2023-02-24 ENCOUNTER — Other Ambulatory Visit: Payer: Self-pay

## 2023-02-24 DIAGNOSIS — E119 Type 2 diabetes mellitus without complications: Secondary | ICD-10-CM

## 2023-02-24 NOTE — Telephone Encounter (Signed)
Please Advise    Copied from CRM [id number]. Topic: Appointments - Scheduling Inquiry for Clinic >> Feb 24, 2023  9:12 AM Robert Walker wrote: Reason for CRM: Pt states he is diabetic and ha been losing his vision. He wants to know if he should schedule a regular appointment for check up or if he can be seen for a diabetic eye exam. Pt callback  [phone]

## 2023-02-24 NOTE — Telephone Encounter (Signed)
Referral placed.

## 2023-03-18 DIAGNOSIS — Z9842 Cataract extraction status, left eye: Secondary | ICD-10-CM | POA: Diagnosis not present

## 2023-03-18 DIAGNOSIS — Z961 Presence of intraocular lens: Secondary | ICD-10-CM | POA: Diagnosis not present

## 2023-03-18 DIAGNOSIS — H04123 Dry eye syndrome of bilateral lacrimal glands: Secondary | ICD-10-CM | POA: Diagnosis not present

## 2023-03-18 DIAGNOSIS — H5202 Hypermetropia, left eye: Secondary | ICD-10-CM | POA: Diagnosis not present

## 2023-03-18 DIAGNOSIS — Z9841 Cataract extraction status, right eye: Secondary | ICD-10-CM | POA: Diagnosis not present

## 2023-03-19 ENCOUNTER — Other Ambulatory Visit: Payer: Self-pay | Admitting: Optometry

## 2023-03-19 DIAGNOSIS — H538 Other visual disturbances: Secondary | ICD-10-CM

## 2023-03-19 NOTE — Progress Notes (Signed)
MRI brain and orbits for bilateral blurry vision with normal ophthalmic exam.

## 2023-03-27 ENCOUNTER — Ambulatory Visit (INDEPENDENT_AMBULATORY_CARE_PROVIDER_SITE_OTHER): Payer: PPO | Admitting: Internal Medicine

## 2023-03-27 ENCOUNTER — Encounter: Payer: Self-pay | Admitting: Internal Medicine

## 2023-03-27 VITALS — BP 128/70 | HR 96 | Ht 68.0 in | Wt 182.8 lb

## 2023-03-27 DIAGNOSIS — H538 Other visual disturbances: Secondary | ICD-10-CM

## 2023-03-27 DIAGNOSIS — I10 Essential (primary) hypertension: Secondary | ICD-10-CM

## 2023-03-27 DIAGNOSIS — E1169 Type 2 diabetes mellitus with other specified complication: Secondary | ICD-10-CM | POA: Diagnosis not present

## 2023-03-27 DIAGNOSIS — F32A Depression, unspecified: Secondary | ICD-10-CM

## 2023-03-27 DIAGNOSIS — E785 Hyperlipidemia, unspecified: Secondary | ICD-10-CM

## 2023-03-27 DIAGNOSIS — E119 Type 2 diabetes mellitus without complications: Secondary | ICD-10-CM

## 2023-03-27 NOTE — Patient Instructions (Signed)
 It was a pleasure to see you today.  Thank you for giving [place] the opportunity to be involved in your care.  Below is a brief recap of your visit and next steps.  We will plan to see you again in 6 months.  Summary No medication changes today Repeat labs ordered Follow up in 6 months

## 2023-03-27 NOTE — Progress Notes (Signed)
 Established Patient Office Visit  Subjective   Patient ID: Robert Walker, male    DOB: 1950-06-17  Age: 73 y.o. MRN: [id number]  Chief Complaint  Patient presents with   Care Management    Follow up    Blurred Vision    Blurry vision    Robert Walker returns to care today for routine follow-up.  He was last evaluated by me in August 2024.  No medication changes were made at that time and 75-month follow-up was arranged.  In the interim, he has been evaluated by ophthalmology and has undergone bilateral cataract extraction.  There have otherwise been no acute interval events.  Today he reports feeling well.  He endorses blurred vision that is currently being worked up by his ophthalmologist.  Recent ophthalmic exams was unremarkable.  An MRI brain is pending.  He does not have any acute concerns to discuss  Past Medical History:  Diagnosis Date   Alcoholism (HCC)    quit in 2022   Depression    Diabetes (HCC)    HLD (hyperlipidemia)    Hypertension    Past Surgical History:  Procedure Laterality Date   CATARACT EXTRACTION W/PHACO Left 11/28/2022   Procedure: CATARACT EXTRACTION PHACO AND INTRAOCULAR LENS PLACEMENT (IOC);  Surgeon: Pecolia Ades, MD;  Location: [facility];  Service: Ophthalmology;  Laterality: Left;  CDE: 3.76   CATARACT EXTRACTION W/PHACO Right 12/12/2022   Procedure: CATARACT EXTRACTION PHACO AND INTRAOCULAR LENS PLACEMENT (IOC);  Surgeon: Pecolia Ades, MD;  Location: [facility];  Service: Ophthalmology;  Laterality: Right;  CDE 4.84   HERNIA REPAIR     TONSILLECTOMY     age 35   Social History   Tobacco Use   Smoking status: Former    Current packs/day: 0.00    Types: Cigarettes    Start date: 7    Quit date: 1985    Years since quitting: 40.1    Passive exposure: Past   Smokeless tobacco: Never  Substance Use Topics   Alcohol use: Not Currently   Drug use: Never   History reviewed. No pertinent family history. Allergies  Allergen Reactions   Bee  Venom Anaphylaxis   Review of Systems  Constitutional:  Negative for chills and fever.  HENT:  Negative for sore throat.   Eyes:  Positive for blurred vision.  Respiratory:  Negative for cough and shortness of breath.   Cardiovascular:  Negative for chest pain, palpitations and leg swelling.  Gastrointestinal:  Negative for abdominal pain, blood in stool, constipation, diarrhea, nausea and vomiting.  Genitourinary:  Negative for dysuria and hematuria.  Musculoskeletal:  Negative for myalgias.  Skin:  Negative for itching and rash.  Neurological:  Negative for dizziness and headaches.  Psychiatric/Behavioral:  Negative for depression and suicidal ideas.      Objective:     BP 128/70   Pulse 96   Ht 5\' 8"  (1.727 m)   Wt 182 lb 12.8 oz (82.9 kg)   SpO2 95%   BMI 27.79 kg/m  BP Readings from Last 3 Encounters:  03/27/23 128/70  12/12/22 134/79  11/28/22 131/82   Physical Exam Vitals reviewed.  Constitutional:      General: He is not in acute distress.    Appearance: Normal appearance. He is not ill-appearing.  HENT:     Head: Normocephalic and atraumatic.     Nose: Nose normal. No congestion or rhinorrhea.     Mouth/Throat:     Mouth: Mucous membranes are moist.  Pharynx: Oropharynx is clear.  Eyes:     Extraocular Movements: Extraocular movements intact.     Conjunctiva/sclera: Conjunctivae normal.     Pupils: Pupils are equal, round, and reactive to light.  Cardiovascular:     Rate and Rhythm: Normal rate and regular rhythm.     Pulses: Normal pulses.     Heart sounds: Murmur heard.  Pulmonary:     Effort: Pulmonary effort is normal.     Breath sounds: Normal breath sounds. No wheezing, rhonchi or rales.  Abdominal:     General: Abdomen is flat. Bowel sounds are normal. There is no distension.     Palpations: Abdomen is soft.     Tenderness: There is no abdominal tenderness.  Musculoskeletal:        General: No swelling or deformity. Normal range of motion.      Cervical back: Normal range of motion.  Skin:    General: Skin is warm and dry.     Capillary Refill: Capillary refill takes less than 2 seconds.  Neurological:     General: No focal deficit present.     Mental Status: He is alert and oriented to person, place, and time.     Motor: No weakness.  Psychiatric:        Mood and Affect: Mood normal.        Behavior: Behavior normal.        Thought Content: Thought content normal.   Last CBC Lab Results  Component Value Date   WBC 6.7 03/27/2023   HGB 15.0 03/27/2023   HCT 43.4 03/27/2023   MCV 94 03/27/2023   MCH 32.4 03/27/2023   RDW 11.8 03/27/2023   PLT 389 03/27/2023   Last metabolic panel Lab Results  Component Value Date   GLUCOSE 96 03/27/2023   NA 135 03/27/2023   K 4.9 03/27/2023   CL 95 (L) 03/27/2023   CO2 23 03/27/2023   BUN 13 03/27/2023   CREATININE 0.83 03/27/2023   EGFR 92 03/27/2023   CALCIUM 10.0 03/27/2023   PROT 7.5 03/27/2023   ALBUMIN 5.1 (H) 03/27/2023   LABGLOB 2.4 03/27/2023   AGRATIO 2.2 11/22/2021   BILITOT 0.3 03/27/2023   ALKPHOS 63 03/27/2023   AST 32 03/27/2023   ALT 24 03/27/2023   Last lipids Lab Results  Component Value Date   CHOL 154 03/27/2023   HDL 36 (L) 03/27/2023   LDLCALC 44 03/27/2023   TRIG 518 (H) 03/27/2023   CHOLHDL 4.3 03/27/2023   Last hemoglobin A1c Lab Results  Component Value Date   HGBA1C 7.2 (H) 03/27/2023   Last thyroid functions Lab Results  Component Value Date   TSH 1.290 03/27/2023   Last vitamin D Lab Results  Component Value Date   VD25OH 32.7 03/27/2023     Assessment & Plan:   Problem List Items Addressed This Visit       Essential hypertension - Primary   Remains well-controlled with lisinopril-HCTZ 20-12.5 mg daily.  No medication changes are indicated today.      Hyperlipidemia associated with type 2 diabetes mellitus (HCC)   Lipid panel last updated in February 2024.  He continues to take simvastatin 40 mg daily.  Repeat  lipid panel ordered today.      Type 2 diabetes mellitus without complications (HCC)   Diet controlled.  A1c 6.7 on labs from August.  Repeat A1c and urine microalbumin/creatinine ratio ordered today.      Depression   Mood remains stable with  citalopram 20 mg daily.  No medication changes are indicated today.      Blurred vision, bilateral   Recent development.  He underwent bilateral cataract extraction last fall and has developed blurred vision since that time.  Ophthalmic exam has been unremarkable.  An MRI brain is pending.      Return in about 6 months (around 09/24/2023).   Billie Lade, MD

## 2023-03-28 ENCOUNTER — Encounter: Payer: Self-pay | Admitting: Internal Medicine

## 2023-03-28 DIAGNOSIS — H538 Other visual disturbances: Secondary | ICD-10-CM | POA: Insufficient documentation

## 2023-03-28 NOTE — Assessment & Plan Note (Addendum)
 Diet controlled.  A1c 6.7 on labs from August.  Repeat A1c and urine microalbumin/creatinine ratio ordered today.

## 2023-03-28 NOTE — Assessment & Plan Note (Signed)
 Lipid panel last updated in February 2024.  He continues to take simvastatin 40 mg daily.  Repeat lipid panel ordered today.

## 2023-03-28 NOTE — Assessment & Plan Note (Signed)
 Recent development.  He underwent bilateral cataract extraction last fall and has developed blurred vision since that time.  Ophthalmic exam has been unremarkable.  An MRI brain is pending.

## 2023-03-28 NOTE — Assessment & Plan Note (Signed)
 Remains well-controlled with lisinopril-HCTZ 20-12.5 mg daily.  No medication changes are indicated today.

## 2023-03-28 NOTE — Assessment & Plan Note (Signed)
 Mood remains stable with citalopram 20 mg daily.  No medication changes are indicated today.

## 2023-03-29 LAB — CBC WITH DIFFERENTIAL/PLATELET
Basophils Absolute: 0.1 10*3/uL (ref 0.0–0.2)
Basos: 1 %
EOS (ABSOLUTE): 0.2 10*3/uL (ref 0.0–0.4)
Eos: 2 %
Hematocrit: 43.4 % (ref 37.5–51.0)
Hemoglobin: 15 g/dL (ref 13.0–17.7)
Immature Grans (Abs): 0 10*3/uL (ref 0.0–0.1)
Immature Granulocytes: 0 %
Lymphocytes Absolute: 1.7 10*3/uL (ref 0.7–3.1)
Lymphs: 26 %
MCH: 32.4 pg (ref 26.6–33.0)
MCHC: 34.6 g/dL (ref 31.5–35.7)
MCV: 94 fL (ref 79–97)
Monocytes Absolute: 0.8 10*3/uL (ref 0.1–0.9)
Monocytes: 12 %
Neutrophils Absolute: 4 10*3/uL (ref 1.4–7.0)
Neutrophils: 59 %
Platelets: 389 10*3/uL (ref 150–450)
RBC: 4.63 x10E6/uL (ref 4.14–5.80)
RDW: 11.8 % (ref 11.6–15.4)
WBC: 6.7 10*3/uL (ref 3.4–10.8)

## 2023-03-29 LAB — B12 AND FOLATE PANEL
Folate: 17.4 ng/mL (ref >3.0)
Vitamin B-12: 731 pg/mL (ref 232–1245)

## 2023-03-29 LAB — CMP14+EGFR
ALT: 24 [IU]/L (ref 0–44)
AST: 32 [IU]/L (ref 0–40)
Albumin: 5.1 g/dL — ABNORMAL HIGH (ref 3.8–4.8)
Alkaline Phosphatase: 63 [IU]/L (ref 44–121)
BUN/Creatinine Ratio: 16 (ref 10–24)
BUN: 13 mg/dL (ref 8–27)
Bilirubin Total: 0.3 mg/dL (ref 0.0–1.2)
CO2: 23 mmol/L (ref 20–29)
Calcium: 10 mg/dL (ref 8.6–10.2)
Chloride: 95 mmol/L — ABNORMAL LOW (ref 96–106)
Creatinine, Ser: 0.83 mg/dL (ref 0.76–1.27)
Globulin, Total: 2.4 g/dL (ref 1.5–4.5)
Glucose: 96 mg/dL (ref 70–99)
Potassium: 4.9 mmol/L (ref 3.5–5.2)
Sodium: 135 mmol/L (ref 134–144)
Total Protein: 7.5 g/dL (ref 6.0–8.5)
eGFR: 92 mL/min/{1.73_m2} (ref >59)

## 2023-03-29 LAB — LIPID PANEL
Chol/HDL Ratio: 4.3 {ratio} (ref 0.0–5.0)
Cholesterol, Total: 154 mg/dL (ref 100–199)
HDL: 36 mg/dL — ABNORMAL LOW (ref >39)
LDL Chol Calc (NIH): 44 mg/dL (ref 0–99)
Triglycerides: 518 mg/dL — ABNORMAL HIGH (ref 0–149)
VLDL Cholesterol Cal: 74 mg/dL — ABNORMAL HIGH (ref 5–40)

## 2023-03-29 LAB — MICROALBUMIN / CREATININE URINE RATIO
Creatinine, Urine: 31.2 mg/dL
Microalb/Creat Ratio: 50 mg/g{creat} — ABNORMAL HIGH (ref 0–29)
Microalbumin, Urine: 15.6 ug/mL

## 2023-03-29 LAB — HEMOGLOBIN A1C
Est. average glucose Bld gHb Est-mCnc: 160 mg/dL
Hgb A1c MFr Bld: 7.2 % — ABNORMAL HIGH (ref 4.8–5.6)

## 2023-03-29 LAB — TSH+FREE T4
Free T4: 1 ng/dL (ref 0.82–1.77)
TSH: 1.29 u[IU]/mL (ref 0.450–4.500)

## 2023-03-29 LAB — VITAMIN D 25 HYDROXY (VIT D DEFICIENCY, FRACTURES): Vit D, 25-Hydroxy: 32.7 ng/mL (ref 30.0–100.0)

## 2023-03-30 ENCOUNTER — Other Ambulatory Visit (HOSPITAL_COMMUNITY): Payer: PPO

## 2023-04-01 ENCOUNTER — Ambulatory Visit (HOSPITAL_COMMUNITY)
Admission: RE | Admit: 2023-04-01 | Discharge: 2023-04-01 | Disposition: A | Discharge status: Home / Self Care | Payer: PPO | Source: Ambulatory Visit | Attending: Optometry | Admitting: Optometry

## 2023-04-01 DIAGNOSIS — H538 Other visual disturbances: Secondary | ICD-10-CM | POA: Diagnosis not present

## 2023-04-01 DIAGNOSIS — G319 Degenerative disease of nervous system, unspecified: Secondary | ICD-10-CM | POA: Diagnosis not present

## 2023-04-01 DIAGNOSIS — J323 Chronic sphenoidal sinusitis: Secondary | ICD-10-CM | POA: Diagnosis not present

## 2023-04-01 DIAGNOSIS — I6782 Cerebral ischemia: Secondary | ICD-10-CM | POA: Diagnosis not present

## 2023-04-01 MED ORDER — GADOBUTROL 1 MMOL/ML IV SOLN
8.0000 mL | Freq: Once | INTRAVENOUS | Status: AC | PRN
  Administered 2023-04-01: 8 mL via INTRAVENOUS

## 2023-04-03 ENCOUNTER — Other Ambulatory Visit: Payer: Self-pay | Admitting: Internal Medicine

## 2023-04-03 DIAGNOSIS — F419 Anxiety disorder, unspecified: Secondary | ICD-10-CM

## 2023-04-03 DIAGNOSIS — E7800 Pure hypercholesterolemia, unspecified: Secondary | ICD-10-CM

## 2023-04-03 MED ORDER — CITALOPRAM HYDROBROMIDE 20 MG PO TABS: 20.0000 mg | ORAL_TABLET | Freq: Every day | ORAL | 3 refills | Status: AC

## 2023-04-03 MED ORDER — LISINOPRIL-HYDROCHLOROTHIAZIDE 20-12.5 MG PO TABS: 1.0000 | ORAL_TABLET | Freq: Every day | ORAL | 3 refills | Status: AC

## 2023-04-03 NOTE — Telephone Encounter (Signed)
 Copied from CRM [id number]. Topic: Clinical - Medication Refill >> Apr 03, 2023  8:28 AM Geroge Baseman wrote: Most Recent Primary Care Visit:  Provider: Christel Mormon E  Department: [facility]  Visit Type: OFFICE VISIT  Date: 03/27/2023  Medication: citalopram (CELEXA) 20 MG tablet, lisinopril-hydrochlorothiazide (ZESTORETIC) 20-12.5 MG tablet  Has the patient contacted their pharmacy? No Going to new pharmacy  Is this the correct pharmacy for this prescription? Yes If no, delete pharmacy and type the correct one.  This is the patient's preferred pharmacy:   ******[facility]  [address] [place], [place], [place] [address] [phone]  Has the prescription been filled recently? No  Is the patient out of the medication? No, a few days left  Has the patient been seen for an appointment in the last year OR does the patient have an upcoming appointment? Yes  Can we respond through MyChart? No  Agent: Please be advised that Rx refills may take up to 3 business days. We ask that you follow-up with your pharmacy.

## 2023-04-03 NOTE — Telephone Encounter (Signed)
 Copied from CRM [id number]. Topic: Clinical - Medication Refill >> Apr 03, 2023  8:34 AM Geroge Baseman wrote: Most Recent Primary Care Visit:  Provider: Christel Mormon E  Department: [facility]  Visit Type: OFFICE VISIT  Date: 03/27/2023  Medication: citalopram (CELEXA) 20 MG tablet, lisinopril-hydrochlorothiazide (ZESTORETIC) 20-12.5 MG tablet  Has the patient contacted their pharmacy? No (Agent: If no, request that the patient contact the pharmacy for the refill. If patient does not wish to contact the pharmacy document the reason why and proceed with request.) (Agent: If yes, when and what did the pharmacy advise?)  Is this the correct pharmacy for this prescription? Yes If no, delete pharmacy and type the correct one.  This is the patient's preferred pharmacy:    [facility]  [address] [place], [place], [place] [address]  [phone]    Has the prescription been filled recently? No  Is the patient out of the medication? No, few days left  Has the patient been seen for an appointment in the last year OR does the patient have an upcoming appointment? Yes  Can we respond through MyChart? No  Agent: Please be advised that Rx refills may take up to 3 business days. We ask that you follow-up with your pharmacy.

## 2023-04-06 ENCOUNTER — Encounter: Payer: Self-pay | Admitting: Internal Medicine

## 2023-04-06 DIAGNOSIS — R930 Abnormal findings on diagnostic imaging of skull and head, not elsewhere classified: Secondary | ICD-10-CM

## 2023-04-09 ENCOUNTER — Other Ambulatory Visit: Payer: Self-pay | Admitting: Internal Medicine

## 2023-04-09 DIAGNOSIS — E7800 Pure hypercholesterolemia, unspecified: Secondary | ICD-10-CM

## 2023-04-09 DIAGNOSIS — F419 Anxiety disorder, unspecified: Secondary | ICD-10-CM

## 2023-04-27 DIAGNOSIS — H43393 Other vitreous opacities, bilateral: Secondary | ICD-10-CM | POA: Diagnosis not present

## 2023-06-16 ENCOUNTER — Institutional Professional Consult (permissible substitution) (INDEPENDENT_AMBULATORY_CARE_PROVIDER_SITE_OTHER)

## 2023-07-07 DIAGNOSIS — H04123 Dry eye syndrome of bilateral lacrimal glands: Secondary | ICD-10-CM | POA: Diagnosis not present

## 2023-07-07 DIAGNOSIS — H538 Other visual disturbances: Secondary | ICD-10-CM | POA: Diagnosis not present

## 2023-07-07 DIAGNOSIS — Z961 Presence of intraocular lens: Secondary | ICD-10-CM | POA: Diagnosis not present

## 2023-07-07 LAB — HM DIABETES EYE EXAM

## 2023-07-10 ENCOUNTER — Ambulatory Visit: Payer: Self-pay

## 2023-07-10 ENCOUNTER — Encounter (HOSPITAL_COMMUNITY): Payer: Self-pay

## 2023-07-10 ENCOUNTER — Emergency Department (HOSPITAL_COMMUNITY)

## 2023-07-10 ENCOUNTER — Other Ambulatory Visit: Payer: Self-pay

## 2023-07-10 ENCOUNTER — Emergency Department (HOSPITAL_COMMUNITY)
Admission: EM | Admit: 2023-07-10 | Discharge: 2023-07-10 | Disposition: A | Discharge status: Home / Self Care | Attending: Emergency Medicine | Admitting: Emergency Medicine

## 2023-07-10 DIAGNOSIS — W268XXA Contact with other sharp object(s), not elsewhere classified, initial encounter: Secondary | ICD-10-CM | POA: Insufficient documentation

## 2023-07-10 DIAGNOSIS — Z7982 Long term (current) use of aspirin: Secondary | ICD-10-CM | POA: Diagnosis not present

## 2023-07-10 DIAGNOSIS — S61216A Laceration without foreign body of right little finger without damage to nail, initial encounter: Secondary | ICD-10-CM | POA: Diagnosis not present

## 2023-07-10 DIAGNOSIS — Z23 Encounter for immunization: Secondary | ICD-10-CM | POA: Diagnosis not present

## 2023-07-10 DIAGNOSIS — M19041 Primary osteoarthritis, right hand: Secondary | ICD-10-CM | POA: Diagnosis not present

## 2023-07-10 MED ORDER — LIDOCAINE HCL (PF) 1 % IJ SOLN
30.0000 mL | Freq: Once | INTRAMUSCULAR | Status: AC
  Administered 2023-07-10: 30 mL
  Filled 2023-07-10: qty 30

## 2023-07-10 MED ORDER — TETANUS-DIPHTH-ACELL PERTUSSIS 5-2.5-18.5 LF-MCG/0.5 IM SUSY
0.5000 mL | PREFILLED_SYRINGE | Freq: Once | INTRAMUSCULAR | Status: AC
  Administered 2023-07-10: 0.5 mL via INTRAMUSCULAR
  Filled 2023-07-10: qty 0.5

## 2023-07-10 MED ORDER — DOUBLE ANTIBIOTIC 500-10000 UNIT/GM EX OINT
TOPICAL_OINTMENT | Freq: Once | CUTANEOUS | Status: DC
  Filled 2023-07-10: qty 1

## 2023-07-10 MED ORDER — CEPHALEXIN 500 MG PO CAPS: 500.0000 mg | ORAL_CAPSULE | Freq: Three times a day (TID) | ORAL | 0 refills | Status: AC

## 2023-07-10 MED ORDER — HYDROCODONE-ACETAMINOPHEN 5-325 MG PO TABS
1.0000 | ORAL_TABLET | Freq: Once | ORAL | Status: DC
  Filled 2023-07-10: qty 1

## 2023-07-10 MED ORDER — CEPHALEXIN 500 MG PO CAPS
500.0000 mg | ORAL_CAPSULE | Freq: Once | ORAL | Status: DC
  Filled 2023-07-10: qty 1

## 2023-07-10 NOTE — Discharge Instructions (Signed)
 Please follow-up in 7 days to have the sutures removed either with your primary care doctor or in the emergency department.  Please continue good wound care as discussed at the bedside.  Take all antibiotics as directed.  Return to emergency department immediately for any new or worsening symptoms.

## 2023-07-10 NOTE — Telephone Encounter (Signed)
   Copied from CRM [id number]. Topic: Clinical - Red Word Triage >> Jul 10, 2023  3:43 PM Fonda T wrote: Red Word that prompted transfer to Nurse Triage: Spouse calling ([facility] verified), states patient cut tip of his finger, took an Asprin this morning, now bleeding won't stop. Reason for Disposition  [1] Bleeding AND [2] won't stop after 10 minutes of direct pressure (using correct technique)  Answer Assessment - Initial Assessment Questions 1. MECHANISM: "How did the injury happen?"      Metal prop from model plane 2. ONSET: "When did the injury happen?" (Minutes or hours ago)      One hour ago 3. LOCATION: "What part of the finger is injured?" "Is the nail damaged?"      Right hand, pinkie finger tip laceration, flap of skin, nail torn and bleeding 4. APPEARANCE of the INJURY: "What does the injury look like?"      laceration 5. SEVERITY: "Can you use the hand normally?"  "Can you bend your fingers into a ball and then fully open them?"     yes 6. SIZE: For cuts, bruises, or swelling, ask: "How large is it?" (e.g., inches or centimeters;  entire finger)  Skin flap      7. PAIN: "Is there pain?" If Yes, ask: "How bad is the pain?"    (e.g., Scale 1-10; or mild, moderate, severe)  - NONE (0): no pain.  - MILD (1-3): doesn't interfere with normal activities.   - MODERATE (4-7): interferes with normal activities or awakens from sleep.  - SEVERE (8-10): excruciating pain, unable to hold a glass of water  or bend finger even a little.      8. TETANUS: For any breaks in the skin, ask: "When was the last tetanus booster?"      9. OTHER SYMPTOMS: "Do you have any other symptoms?"      On aspirin.  Protocols used: Finger Injury-A-AH

## 2023-07-10 NOTE — ED Provider Notes (Signed)
 [place] EMERGENCY DEPARTMENT AT [facility] Provider Note   CSN: [id number] Arrival date & time: 07/10/23  1624     History  Chief Complaint  Patient presents with   Laceration    Robert Walker is a 73 y.o. male.  Is a 73 year old male who presents to the emergency department the chief complaint of a laceration over the dorsal and distal aspect of the right little finger.  Patient notes that he was working with a Dietitian when the propeller cut his finger.  Patient is unsure of his last tetanus shot.  He notes he still has full flexion and extension of the affected digit.  He denies any known history of bleed disorders or current anticoagulation use.  He does take a baby aspirin daily.  He denies any other secondary sites of injury or pain.   Laceration      Home Medications Prior to Admission medications   Medication Sig Start Date End Date Taking? Authorizing Provider  aspirin EC 81 MG tablet Take 81 mg by mouth daily. Swallow whole.    [provider]  citalopram  (CELEXA ) 20 MG tablet Take 1 tablet (20 mg total) by mouth daily. 04/03/23   Tobi Fortes, MD  Flaxseed, Linseed, (FLAXSEED OIL) 1000 MG CAPS Take by mouth.    [provider]  fluorometholone (FML) 0.1 % ophthalmic suspension Place 1 drop into both eyes 2 (two) times daily. 05/12/23   [provider]  Lactobacillus-Inulin (CULTURELLE ADULT ULT BALANCE) CAPS Take by mouth in the morning and at bedtime.    [provider]  latanoprost (XALATAN) 0.005 % ophthalmic solution Place 1 drop into both eyes at bedtime. 08/06/18   [provider]  lisinopril -hydrochlorothiazide  (ZESTORETIC ) 20-12.5 MG tablet Take 1 tablet by mouth daily. 04/03/23   Dixon, Phillip E, MD  Misc Natural Products (PROSTATE SUPPORT PO) Take by mouth. 3 per day    [provider]  Multiple Vitamins-Minerals (PRESERVISION AREDS 2 PO) Take by mouth. 2 per day    [provider]  simvastatin  (ZOCOR ) 40 MG tablet TAKE 1 TABLET(40 MG) BY MOUTH AT BEDTIME 05/13/22   Dixon, Phillip E, MD  Turmeric (QC TUMERIC COMPLEX PO) Take by mouth. One daily    [provider]  Wheat Dextrin (BENEFIBER PO) Take by mouth. Once daily    [provider]  XIIDRA 5 % SOLN Apply 1 drop to eye 2 (two) times daily. 07/06/23   [provider]      Allergies    Bee venom    Review of Systems   Review of Systems  Skin:        Laceration  All other systems reviewed and are negative.   Physical Exam Updated Vital Signs BP (!) 166/93 (BP Location: Left Arm)   Pulse 89   Temp 98 F (36.7 C) (Oral)   Resp 16   Ht 5' 8 (1.727 m)   Wt 81.6 kg   SpO2 94%   BMI 27.37 kg/m  Physical Exam Vitals and nursing note reviewed.  Constitutional:      Appearance: Normal appearance.  HENT:     Head: Normocephalic and atraumatic.  Cardiovascular:     Rate and Rhythm: Normal rate and regular rhythm.     Pulses: Normal pulses.  Pulmonary:     Effort: Pulmonary effort is normal. No respiratory distress.  Musculoskeletal:        General: Normal range of motion.  Comments: 1 cm laceration noted over the dorsal aspect of the right little finger just proximal to the nail, skin avulsion noted over the distal aspect of the right little finger, no bony tenderness noted throughout, no foreign bodies noted within the wound and wound evaluated to depth with adequate lighting, no obvious indication for tendon injury, flexion and extension intact with each joint isolated, sensation intact distally and cap refill less than 2 seconds distally  Skin:    General: Skin is warm and dry.     Comments: Laceration to right little finger described and musculoskeletal exam  Neurological:     General: No focal deficit present.     Mental Status: He is alert and oriented to person, place, and time. Mental status is at baseline.  Psychiatric:        Mood and Affect: Mood  normal.        Behavior: Behavior normal.        Thought Content: Thought content normal.        Judgment: Judgment normal.     ED Results / Procedures / Treatments   Labs (all labs ordered are listed, but only abnormal results are displayed) Labs Reviewed - No data to display  EKG None  Radiology DG Finger Little Right Result Date: 07/10/2023 CLINICAL DATA:  Laceration to the right small finger EXAM: RIGHT LITTLE FINGER 3 v COMPARISON:  None Available. FINDINGS: There is no evidence of fracture or dislocation. Degenerative changes of the small finger distal interphalangeal joint with joint space narrowing and osteophyte formation. Soft tissues are unremarkable. Lobulated subtle densities projecting over the distal small finger, likely external dressing material. No radiopaque foreign body. IMPRESSION: 1. No acute fracture or dislocation. 2.  No radiopaque foreign body. Electronically Signed   By: Limin  Xu M.D.   On: 07/10/2023 17:45    Procedures .Laceration Repair  Date/Time: 07/10/2023 6:20 PM  Performed by: Roselynn Connors, PA-C Authorized by: Roselynn Connors, PA-C   Consent:    Consent obtained:  Verbal   Consent given by:  Patient   Risks, benefits, and alternatives were discussed: yes     Risks discussed:  Infection, pain, retained foreign body and need for additional repair   Alternatives discussed:  No treatment, delayed treatment and observation Universal protocol:    Procedure explained and questions answered to patient or proxy's satisfaction: yes     Relevant documents present and verified: yes     Imaging studies available: yes     Immediately prior to procedure, a time out was called: yes     Patient identity confirmed:  Arm band, verbally with patient, provided demographic data and hospital-assigned identification number Anesthesia:    Anesthesia method:  Local infiltration   Local anesthetic:  Lidocaine  1% w/o epi Laceration details:    Location:   Finger   Finger location:  R small finger   Length (cm):  1 Pre-procedure details:    Preparation:  Patient was prepped and draped in usual sterile fashion Exploration:    Limited defect created (wound extended): no     Hemostasis achieved with:  Tourniquet and direct pressure   Imaging obtained: x-ray     Imaging outcome: foreign body not noted     Wound exploration: wound explored through full range of motion and entire depth of wound visualized     Wound extent: fascia not violated, no foreign body, no signs of injury, no tendon damage, no underlying fracture and no vascular damage  Contaminated: no   Treatment:    Area cleansed with:  Shur-Clens   Amount of cleaning:  Extensive   Debridement:  None   Undermining:  None   Scar revision: no   Skin repair:    Repair method:  Sutures   Suture size:  4-0   Suture material:  Prolene   Suture technique:  Simple interrupted   Number of sutures:  3 Approximation:    Approximation:  Close Repair type:    Repair type:  Simple Post-procedure details:    Dressing:  Antibiotic ointment and non-adherent dressing   Procedure completion:  Tolerated well, no immediate complications     Medications Ordered in ED Medications  polymixin-bacitracin  (POLYSPORIN ) ointment (has no administration in time range)  HYDROcodone -acetaminophen  (NORCO/VICODIN) 5-325 MG per tablet 1 tablet (has no administration in time range)  cephALEXin  (KEFLEX ) capsule 500 mg (has no administration in time range)  Tdap (BOOSTRIX ) injection 0.5 mL (0.5 mLs Intramuscular Given 07/10/23 1718)  lidocaine  (PF) (XYLOCAINE ) 1 % injection 30 mL (30 mLs Other Given 07/10/23 1729)    ED Course/ Medical Decision Making/ A&P                                 Medical Decision Making Patient is doing well at this time and is stable for discharge home.  Laceration to the right little finger was repaired at the bedside.  Evaluation demonstrated no obvious indication for tendon or  bony involvement.  Patient had full range of motion with each joint isolated with no indication for tendon injury.  He was neurovascularly intact distally before and after procedure.  The skin avulsion was not amenable to suturing so Dermabond was placed over the site for hemostasis.  Patient had no bleeding after the procedure.  Continue good wound care on outpatient basis was discussed.  Strict turn precautions were discussed for any new or worsening symptoms.  Patient voiced understanding to the plan and had no additional questions.  No foreign bodies were noted within the wound and wound was evaluated to depth with adequate lighting.  Amount and/or Complexity of Data Reviewed Radiology: ordered.  Risk OTC drugs. Prescription drug management.           Final Clinical Impression(s) / ED Diagnoses Final diagnoses:  None    Rx / DC Orders ED Discharge Orders     None         Emmalene Hare 07/10/23 1824    Ninetta Basket, MD 07/15/23 1227

## 2023-07-10 NOTE — ED Triage Notes (Signed)
 Pt reports he lacerated his right fifth finger on a remote control airplane propeller.

## 2023-07-13 NOTE — Telephone Encounter (Signed)
Noted, patient advised to go to urgent care

## 2023-07-14 ENCOUNTER — Telehealth: Payer: Self-pay | Admitting: Internal Medicine

## 2023-07-14 NOTE — Telephone Encounter (Signed)
 Patient wanting a call back- informed agent Rice Chamorro has opening tomorrow but no availability with PCP. Pt wants an appt for Friday- I have nothing available. Please advise Thanks

## 2023-07-14 NOTE — Telephone Encounter (Signed)
 Copied from CRM [id number]. Topic: Appointments - Scheduling Inquiry for Clinic >> Jul 13, 2023  8:20 AM Dimple Francis wrote: Reason for CRM: Patient was seen at ER on 6/6 and ended up getting stiches in his finger. He needs to get them removed by next Monday 6/16, but there is no availability until 6/19. Please reach out to patient >> Jul 14, 2023  8:32 AM Alysia Jumbo S wrote: Patient calling back to schedule appointment for stitch removal for Friday, 07/17/2023. Schedule shows no availability until 07/27/2023. Contacted [facility] and was advised to tell patient to return to ER or urgent care. Patient states that he does not want to pay the co-pay for return ER visit. Patient is requesting a callback from provider, Dr. Kermit Ped.  >> Jul 13, 2023  9:08 AM CMA Crystal B wrote: Not our patient

## 2023-07-16 NOTE — Telephone Encounter (Signed)
Faxed for records

## 2023-07-17 ENCOUNTER — Encounter: Payer: Self-pay | Admitting: Internal Medicine

## 2023-07-17 ENCOUNTER — Ambulatory Visit (INDEPENDENT_AMBULATORY_CARE_PROVIDER_SITE_OTHER): Admitting: Internal Medicine

## 2023-07-17 VITALS — BP 138/82 | HR 82 | Ht 68.0 in | Wt 180.0 lb

## 2023-07-17 DIAGNOSIS — I10 Essential (primary) hypertension: Secondary | ICD-10-CM

## 2023-07-17 DIAGNOSIS — S61216A Laceration without foreign body of right little finger without damage to nail, initial encounter: Secondary | ICD-10-CM | POA: Insufficient documentation

## 2023-07-17 DIAGNOSIS — Z09 Encounter for follow-up examination after completed treatment for conditions other than malignant neoplasm: Secondary | ICD-10-CM

## 2023-07-17 DIAGNOSIS — E1169 Type 2 diabetes mellitus with other specified complication: Secondary | ICD-10-CM

## 2023-07-17 DIAGNOSIS — S61216D Laceration without foreign body of right little finger without damage to nail, subsequent encounter: Secondary | ICD-10-CM | POA: Diagnosis not present

## 2023-07-17 DIAGNOSIS — E782 Mixed hyperlipidemia: Secondary | ICD-10-CM | POA: Diagnosis not present

## 2023-07-17 NOTE — Assessment & Plan Note (Signed)
 BP Readings from Last 1 Encounters:  07/17/23 138/82   Well-controlled with lisinopril -HCTZ 20-12.5 mg QD Counseled for compliance with the medications Advised DASH diet and moderate exercise/walking, at least 150 mins/week

## 2023-07-17 NOTE — Assessment & Plan Note (Signed)
 On simvastatin  40 mg QD

## 2023-07-17 NOTE — Progress Notes (Signed)
 Established Patient Office Visit  Subjective:  Patient ID: Robert Walker, male    DOB: 09-26-1950  Age: 73 y.o. MRN: [id number]  CC:  Chief Complaint  Patient presents with   Suture / Staple Removal    Pt needing stiches removed off his right pinky finger has 3 stitches.     HPI Robert Walker is a 73 y.o. male with past medical history of HTN, type II DM, HLD and MDD who presents for f/u of recent ER visit.  He had a laceration over the dorsal and distal aspect of the right little finger on 07/10/23. Patient notes that he was working with a Dietitian when the propeller cut his finger. He had 3 sutures placed during ER visit. He was given Tdap vaccine as well. He has been taking Keflex  for wound bacterial ppx. Denies any episode of fever or chills. Denies excessive bleeding or pus discharge.  HTN: BP is well-controlled. Takes lisinopril -HCTZ 20-12.5 mg QD regularly. Patient denies headache, dizziness, chest pain, dyspnea or palpitations.  Type II DM: Has been diet controlled.  His last HbA1c was 7.2 in 02/25.  He has been trying to cut down sweets.  Denies chronic fatigue, polyuria or polyphagia.  He takes simvastatin  40 mg QD for HLD.  MDD: He takes citalopram  20 mg QD.  Past Medical History:  Diagnosis Date   Alcoholism (HCC)    quit in 2022   Depression    Diabetes (HCC)    HLD (hyperlipidemia)    Hypertension     Past Surgical History:  Procedure Laterality Date   CATARACT EXTRACTION W/PHACO Left 11/28/2022   Procedure: CATARACT EXTRACTION PHACO AND INTRAOCULAR LENS PLACEMENT (IOC);  Surgeon: Ardeth Krabbe, MD;  Location: [facility];  Service: Ophthalmology;  Laterality: Left;  CDE: 3.76   CATARACT EXTRACTION W/PHACO Right 12/12/2022   Procedure: CATARACT EXTRACTION PHACO AND INTRAOCULAR LENS PLACEMENT (IOC);  Surgeon: Ardeth Krabbe, MD;  Location: [facility];  Service: Ophthalmology;  Laterality: Right;  CDE 4.84   HERNIA REPAIR     TONSILLECTOMY     age 21     History reviewed. No pertinent family history.  Social History   Socioeconomic History   Marital status: Married    Spouse name: Not on file   Number of children: Not on file   Years of education: Not on file   Highest education level: Not on file  Occupational History   Not on file  Tobacco Use   Smoking status: Former    Current packs/day: 0.00    Types: Cigarettes    Start date: 95    Quit date: 67    Years since quitting: 40.4    Passive exposure: Past   Smokeless tobacco: Never  Vaping Use   Vaping status: Never Used  Substance and Sexual Activity   Alcohol use: Not Currently   Drug use: Never   Sexual activity: Not on file  Other Topics Concern   Not on file  Social History Narrative   Not on file   Social Drivers of Health   Financial Resource Strain: Not on file  Food Insecurity: Not on file  Transportation Needs: Not on file  Physical Activity: Not on file  Stress: Not on file  Social Connections: Not on file  Intimate Partner Violence: Not on file    Outpatient Medications Prior to Visit  Medication Sig Dispense Refill   aspirin EC 81 MG tablet Take 81 mg by mouth daily. Swallow whole.  cephALEXin  (KEFLEX ) 500 MG capsule Take 1 capsule (500 mg total) by mouth 3 (three) times daily for 7 days. 21 capsule 0   citalopram  (CELEXA ) 20 MG tablet Take 1 tablet (20 mg total) by mouth daily. 90 tablet 3   Flaxseed, Linseed, (FLAXSEED OIL) 1000 MG CAPS Take by mouth.     fluorometholone (FML) 0.1 % ophthalmic suspension Place 1 drop into both eyes 2 (two) times daily.     Lactobacillus-Inulin (CULTURELLE ADULT ULT BALANCE) CAPS Take by mouth in the morning and at bedtime.     latanoprost (XALATAN) 0.005 % ophthalmic solution Place 1 drop into both eyes at bedtime.     lisinopril -hydrochlorothiazide  (ZESTORETIC ) 20-12.5 MG tablet Take 1 tablet by mouth daily. 90 tablet 3   Misc Natural Products (PROSTATE SUPPORT PO) Take by mouth. 3 per day      Multiple Vitamins-Minerals (PRESERVISION AREDS 2 PO) Take by mouth. 2 per day     simvastatin  (ZOCOR ) 40 MG tablet TAKE 1 TABLET(40 MG) BY MOUTH AT BEDTIME 90 tablet 3   Turmeric (QC TUMERIC COMPLEX PO) Take by mouth. One daily     Wheat Dextrin (BENEFIBER PO) Take by mouth. Once daily     XIIDRA 5 % SOLN Apply 1 drop to eye 2 (two) times daily.     No facility-administered medications prior to visit.    Allergies  Allergen Reactions   Bee Venom Anaphylaxis    ROS Review of Systems  Constitutional:  Negative for chills and fever.  HENT:  Negative for congestion and sore throat.   Eyes:  Negative for pain and discharge.  Respiratory:  Negative for cough and shortness of breath.   Cardiovascular:  Negative for chest pain and palpitations.  Gastrointestinal:  Negative for diarrhea, nausea and vomiting.  Endocrine: Negative for polydipsia and polyuria.  Genitourinary:  Negative for dysuria and hematuria.  Musculoskeletal:  Negative for neck pain and neck stiffness.  Skin:  Negative for rash.       Laceration of right little finger  Neurological:  Negative for dizziness, weakness, numbness and headaches.  Psychiatric/Behavioral:  Negative for agitation and behavioral problems.       Objective:    Physical Exam Vitals reviewed.  Constitutional:      General: He is not in acute distress.    Appearance: He is not diaphoretic.  HENT:     Head: Normocephalic and atraumatic.     Nose: Nose normal.     Mouth/Throat:     Mouth: Mucous membranes are moist.   Eyes:     General: No scleral icterus.    Extraocular Movements: Extraocular movements intact.    Cardiovascular:     Rate and Rhythm: Normal rate and regular rhythm.     Heart sounds: Normal heart sounds. No murmur heard. Pulmonary:     Breath sounds: Normal breath sounds. No wheezing or rales.   Musculoskeletal:     Cervical back: Neck supple. No tenderness.     Right lower leg: No edema.     Left lower leg: No  edema.   Skin:    General: Skin is warm.     Findings: No rash.     Comments: Laceration over right little finger tip Has 3 stitches over the dorsal part of little finger, removed today   Neurological:     General: No focal deficit present.     Mental Status: He is alert and oriented to person, place, and time.     Sensory:  No sensory deficit.     Motor: No weakness.   Psychiatric:        Mood and Affect: Mood normal.        Behavior: Behavior normal.     BP 138/82 (BP Location: Left Arm)   Pulse 82   Ht 5' 8 (1.727 m)   Wt 180 lb (81.6 kg)   SpO2 96%   BMI 27.37 kg/m  Wt Readings from Last 3 Encounters:  07/17/23 180 lb (81.6 kg)  07/10/23 180 lb (81.6 kg)  03/27/23 182 lb 12.8 oz (82.9 kg)    Lab Results  Component Value Date   TSH 1.290 03/27/2023   Lab Results  Component Value Date   WBC 6.7 03/27/2023   HGB 15.0 03/27/2023   HCT 43.4 03/27/2023   MCV 94 03/27/2023   PLT 389 03/27/2023   Lab Results  Component Value Date   NA 135 03/27/2023   K 4.9 03/27/2023   CO2 23 03/27/2023   GLUCOSE 96 03/27/2023   BUN 13 03/27/2023   CREATININE 0.83 03/27/2023   BILITOT 0.3 03/27/2023   ALKPHOS 63 03/27/2023   AST 32 03/27/2023   ALT 24 03/27/2023   PROT 7.5 03/27/2023   ALBUMIN 5.1 (H) 03/27/2023   CALCIUM 10.0 03/27/2023   EGFR 92 03/27/2023   Lab Results  Component Value Date   CHOL 154 03/27/2023   Lab Results  Component Value Date   HDL 36 (L) 03/27/2023   Lab Results  Component Value Date   LDLCALC 44 03/27/2023   Lab Results  Component Value Date   TRIG 518 (H) 03/27/2023   Lab Results  Component Value Date   CHOLHDL 4.3 03/27/2023   Lab Results  Component Value Date   HGBA1C 7.2 (H) 03/27/2023      Assessment & Plan:   Problem List Items Addressed This Visit       Cardiovascular and Mediastinum   Essential hypertension   BP Readings from Last 1 Encounters:  07/17/23 138/82   Well-controlled with lisinopril -HCTZ  20-12.5 mg QD Counseled for compliance with the medications Advised DASH diet and moderate exercise/walking, at least 150 mins/week       Relevant Orders   Basic Metabolic Panel (BMET)     Endocrine   Type 2 diabetes mellitus with other specified complication (HCC)   Lab Results  Component Value Date   HGBA1C 7.2 (H) 03/27/2023   Overall well-controlled Associated with HTN and HLD Diet controlled, prefers to avoid medicine Advised to follow diabetic diet On statin and ACEi F/u CMP and lipid panel Diabetic eye exam: Advised to follow up with Ophthalmology for diabetic eye exam      Relevant Orders   Basic Metabolic Panel (BMET)   Hemoglobin A1c     Other   HLD (hyperlipidemia)   On simvastatin  40 mg QD      Relevant Orders   Lipid Profile   Laceration of right little finger without foreign body without damage to nail   Has 3 stitches over the dorsal part of right little finger, stitches removed today Advised to use Neosporin and apply clean bandage, change bandage QD Complete oral Keflex , was prescribed from ER      Encounter for examination following treatment at hospital - Primary   ER chart reviewed Had laceration repair with 3 stitches on the dorsal part of right little finger, stitches removed today       No orders of the defined types were placed in  this encounter.   Follow-up: Return if symptoms worsen or fail to improve.    Meldon Sport, MD

## 2023-07-17 NOTE — Patient Instructions (Signed)
 Please complete antibiotic course.  Please continue to take medications as prescribed.  Please continue to follow low carb diet and perform moderate exercise/walking at least 150 mins/week.

## 2023-07-17 NOTE — Assessment & Plan Note (Signed)
 ER chart reviewed Had laceration repair with 3 stitches on the dorsal part of right little finger, stitches removed today

## 2023-07-17 NOTE — Assessment & Plan Note (Signed)
 Has 3 stitches over the dorsal part of right little finger, stitches removed today Advised to use Neosporin and apply clean bandage, change bandage QD Complete oral Keflex , was prescribed from ER

## 2023-07-17 NOTE — Assessment & Plan Note (Addendum)
 Lab Results  Component Value Date   HGBA1C 7.2 (H) 03/27/2023   Overall well-controlled Associated with HTN and HLD Diet controlled, prefers to avoid medicine Advised to follow diabetic diet On statin and ACEi F/u CMP and lipid panel Diabetic eye exam: Advised to follow up with Ophthalmology for diabetic eye exam

## 2023-07-21 ENCOUNTER — Ambulatory Visit: Payer: Self-pay | Admitting: Internal Medicine

## 2023-08-06 ENCOUNTER — Other Ambulatory Visit: Payer: Self-pay | Admitting: Internal Medicine

## 2023-08-06 DIAGNOSIS — E7800 Pure hypercholesterolemia, unspecified: Secondary | ICD-10-CM

## 2023-08-25 IMAGING — DX DG HAND COMPLETE 3+V*L*
3 series · 3 of 3 positions shown · non-contrast
Comparison: None.

CLINICAL DATA: Left hand pain.  Initial encounter

EXAM:
LEFT HAND - COMPLETE 3+ VIEW

[hand pa]
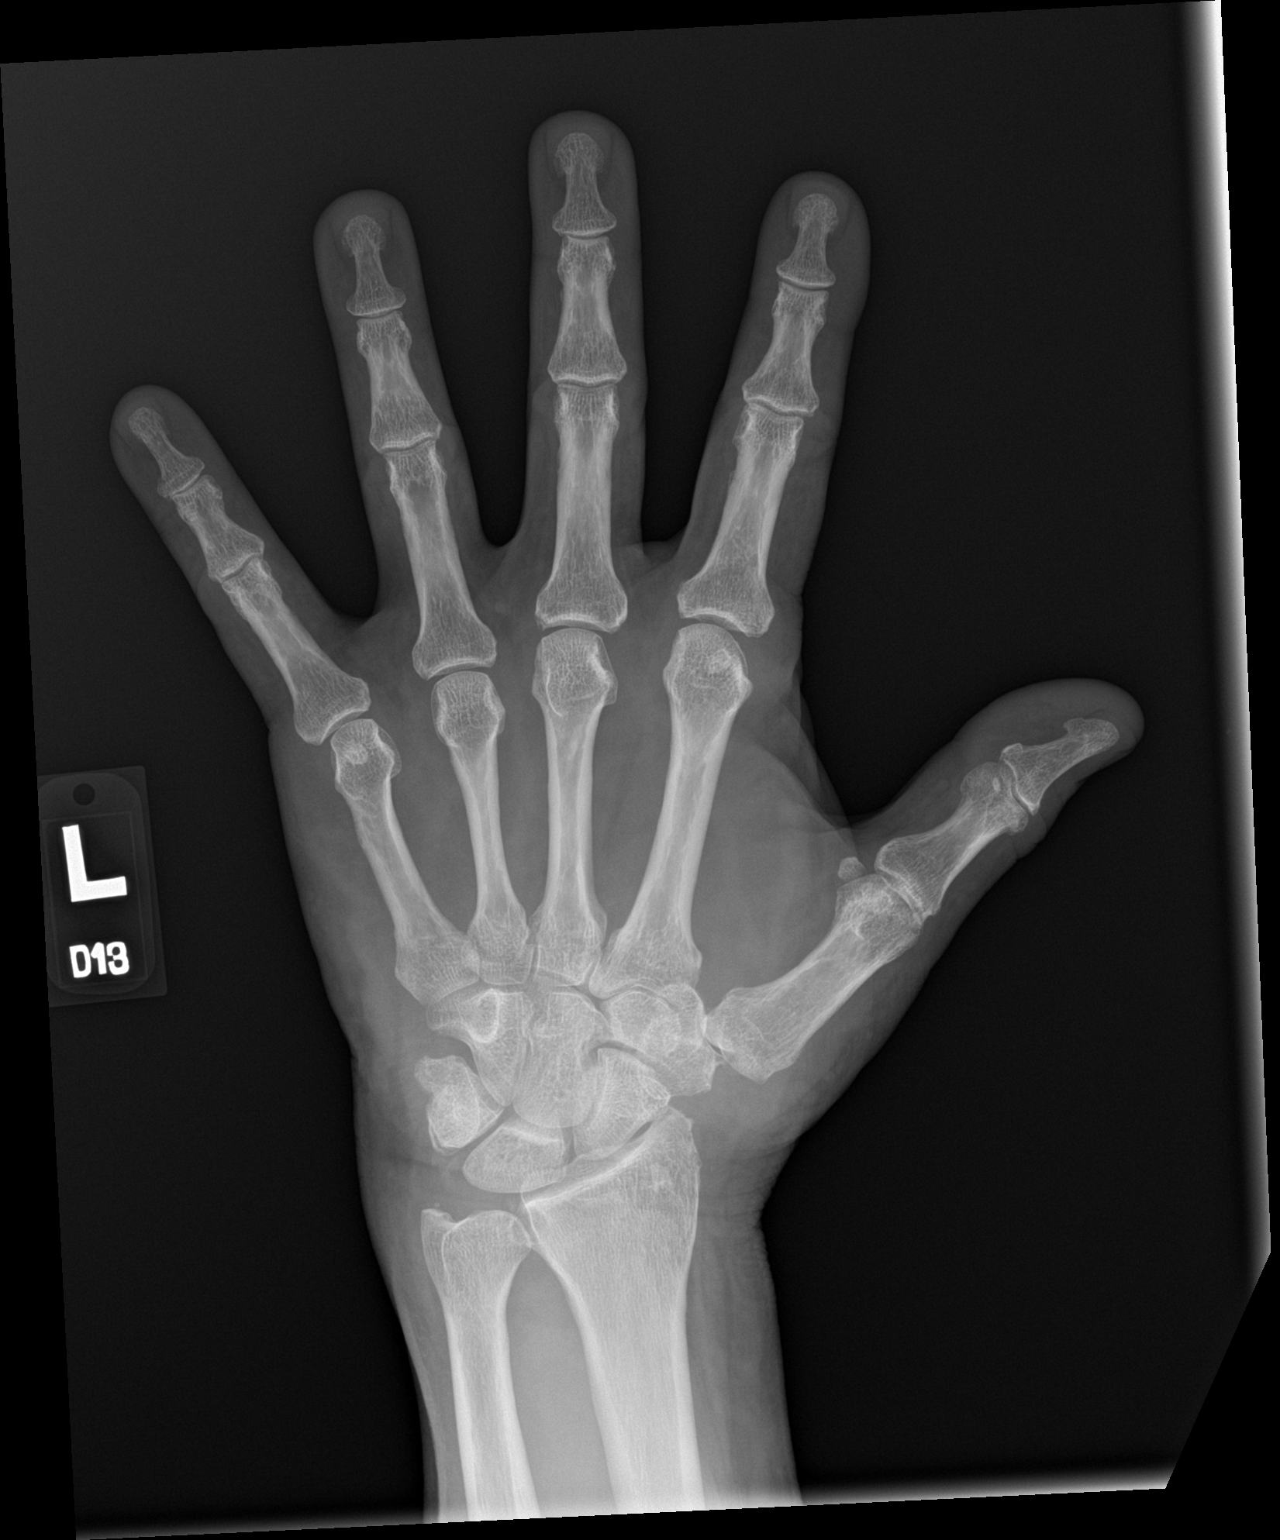

[hand obl]
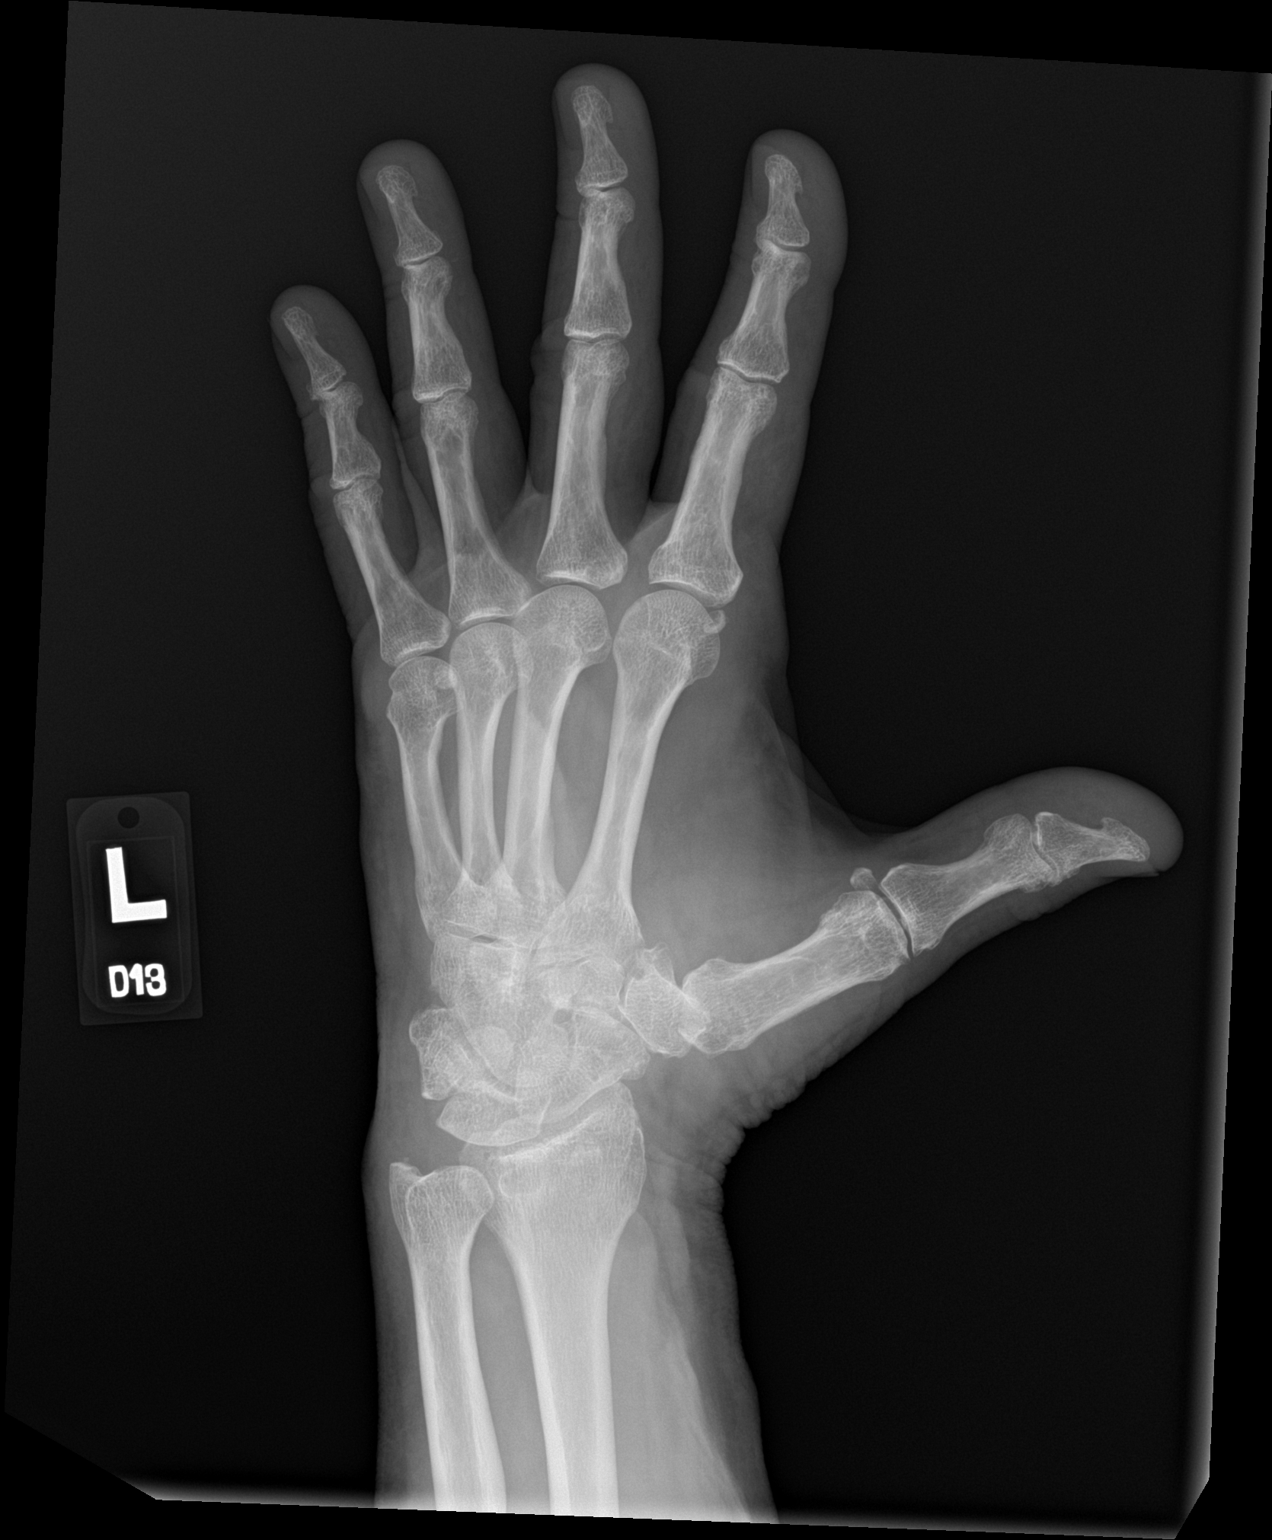

[hand lat]
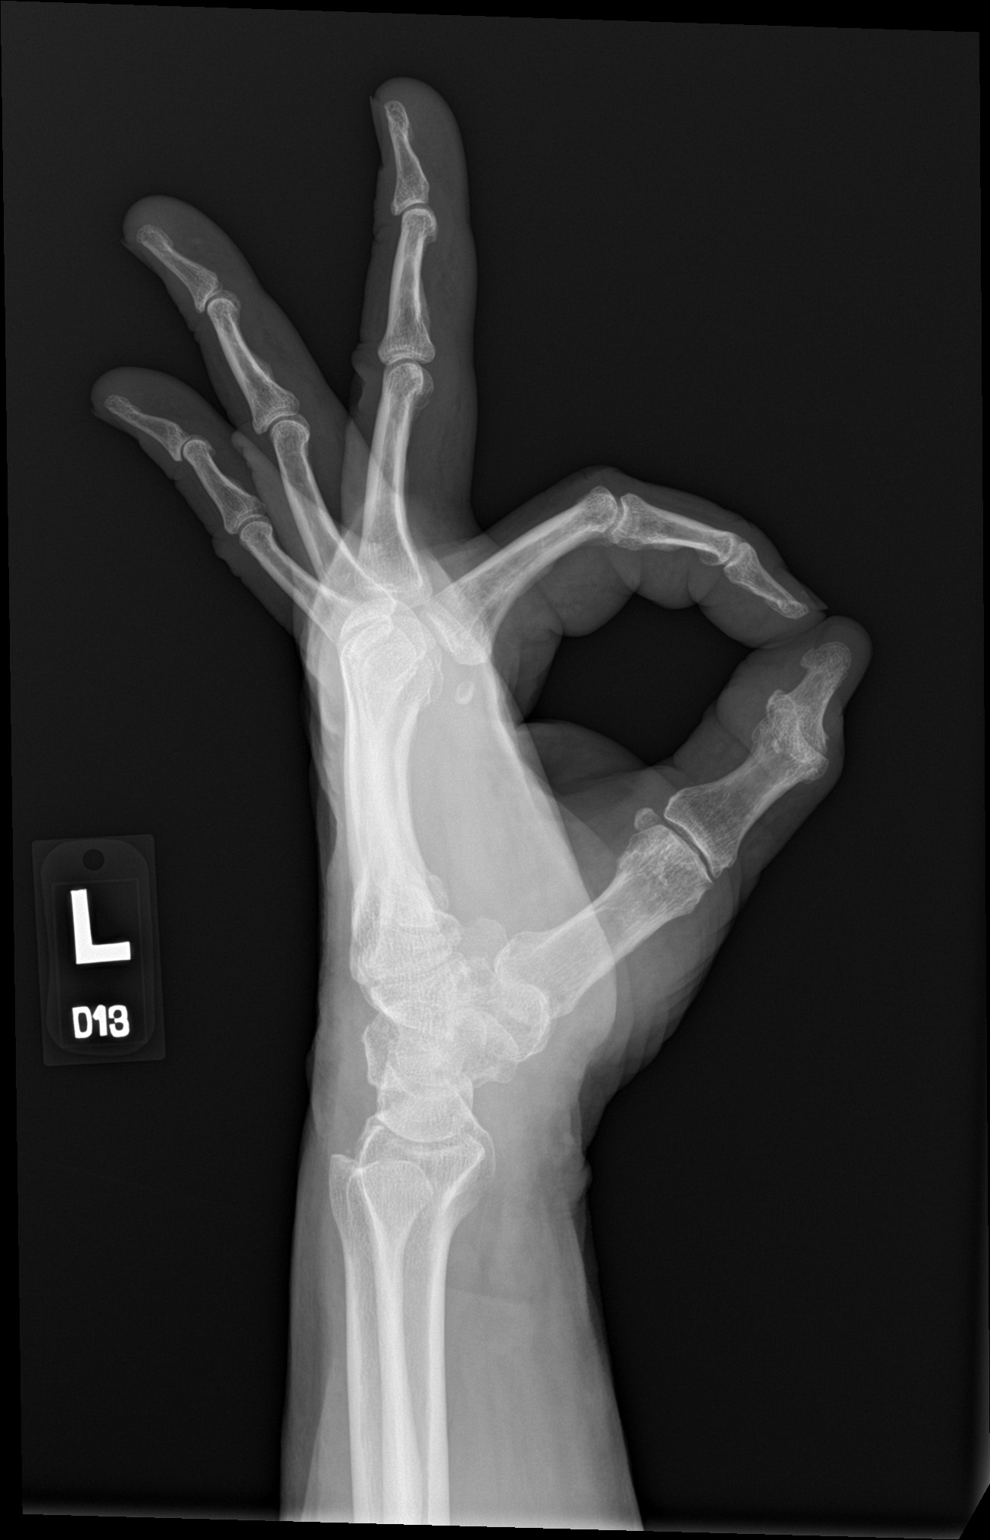

[3 of 3 positions shown; findings below may reference images not displayed]

FINDINGS: Mild subluxation at the first CMC joint is noted. No acute fracture
or dislocation is noted. No soft tissue abnormality is seen.
IMPRESSION: Mild degenerative change without acute abnormality.

## 2023-09-06 IMAGING — CT CT WRIST*R* W/O CM
3 of 5 series · 14 of 35 positions shown, 16 images · non-contrast
Comparison: 04/22/2021

CLINICAL DATA: Right wrist pain.

EXAM:
CT OF THE RIGHT WRIST WITHOUT CONTRAST
TECHNIQUE: Multidetector CT imaging of the right wrist was performed according
to the standard protocol. Multiplanar CT image reconstructions were
also generated.
RADIATION DOSE REDUCTION: This exam was performed according to the
departmental dose-optimization program which includes automated
exposure control, adjustment of the mA and/or kV according to
patient size and/or use of iterative reconstruction technique.

[Series 5: axial soft · axial · 0.32mm/px · z∈[-182,-82]mm · 5 of 76 slices shown, 7 images]
[im 13/76  soft-tissue]
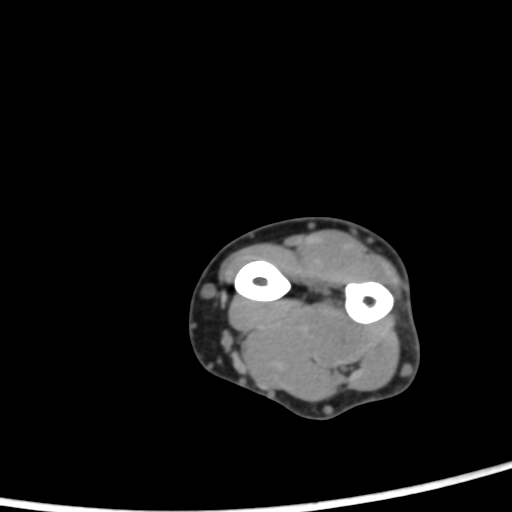
[im 13/76  bone]
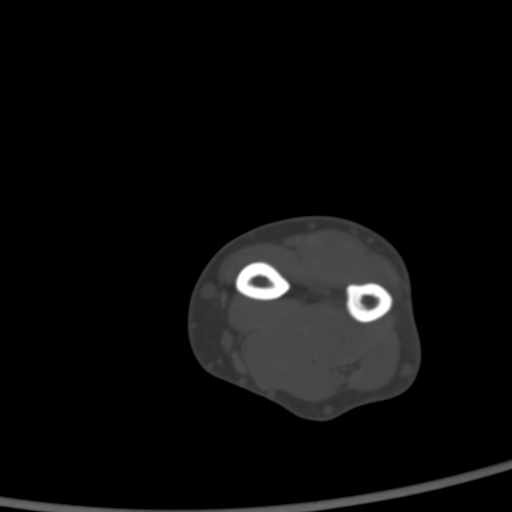
[im 26/76  bone]
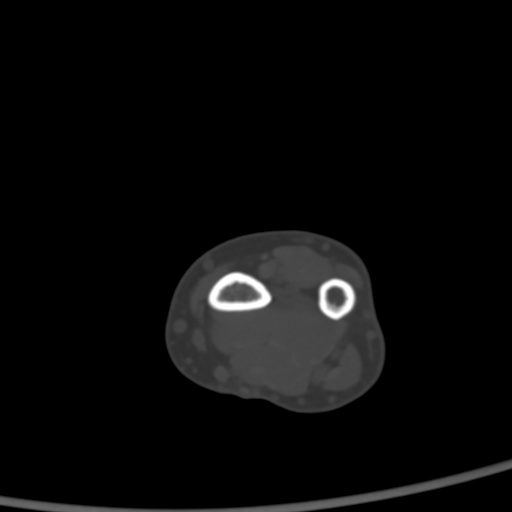
[im 38/76  bone]
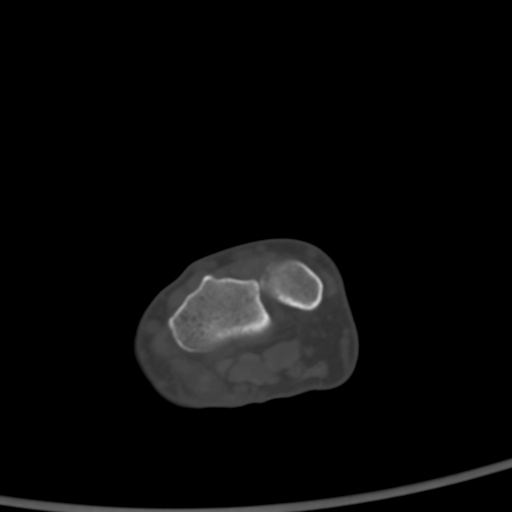
[im 51/76  bone]
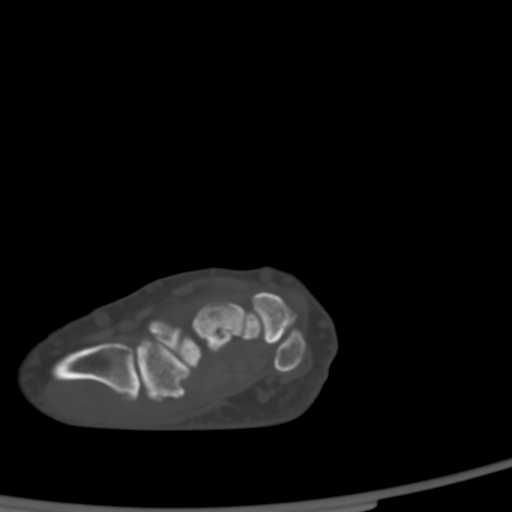
[im 63/76  soft-tissue]
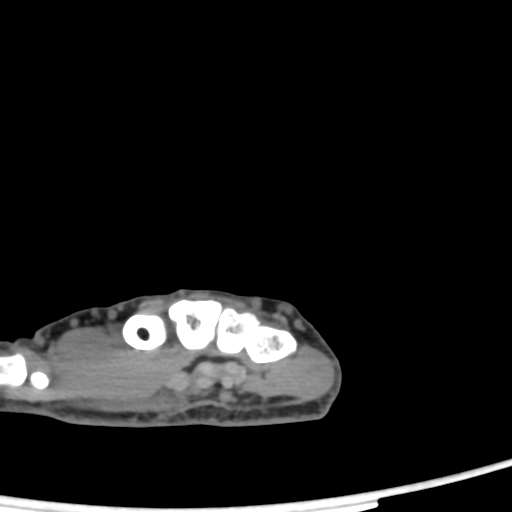
[im 63/76  bone]
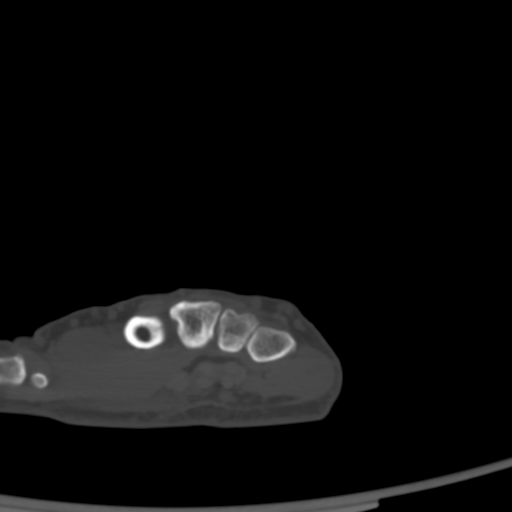

[Series 7: sagittal bone · sagittal · 0.23mm/px · 6 of 90 slices shown]
[im 15/90  bone]
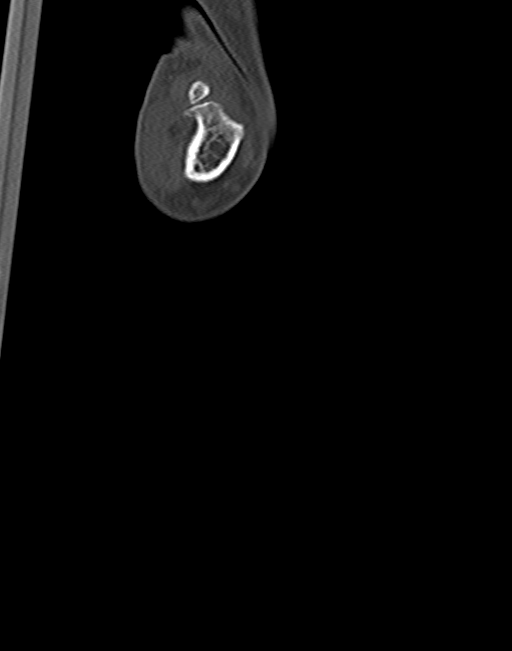
[im 30/90  bone]
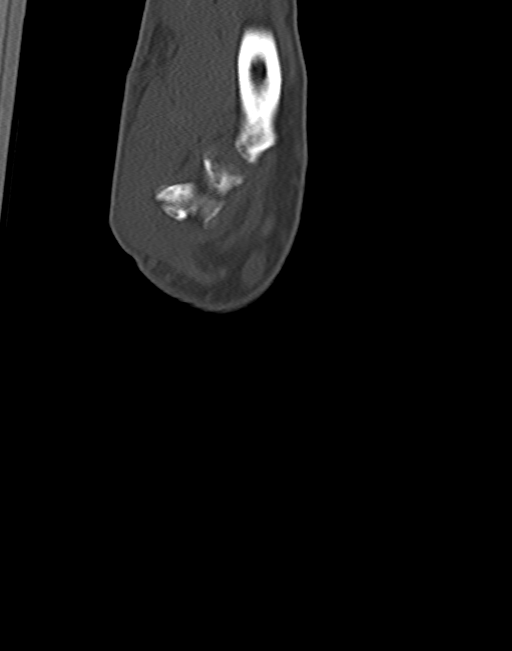
[im 36/90  soft-tissue]
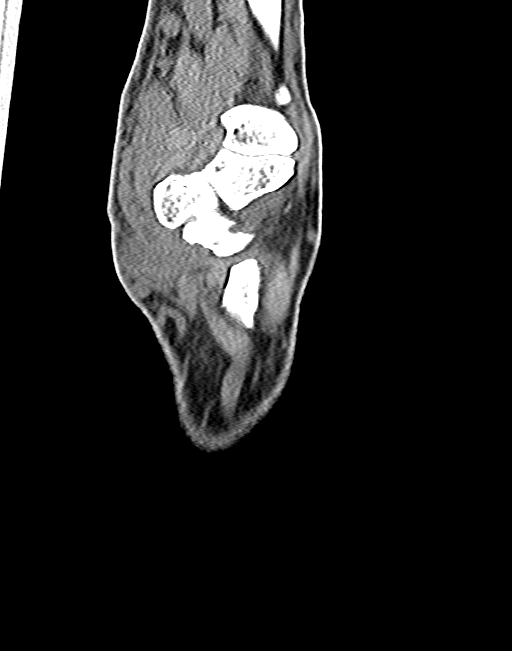
[im 45/90  bone]
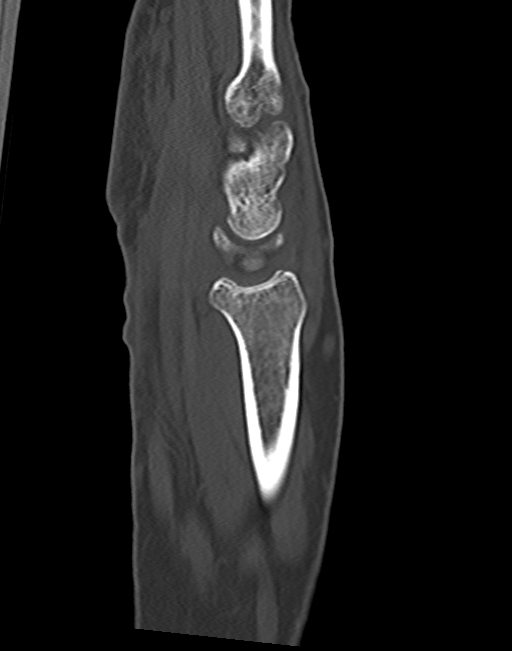
[im 60/90  bone]
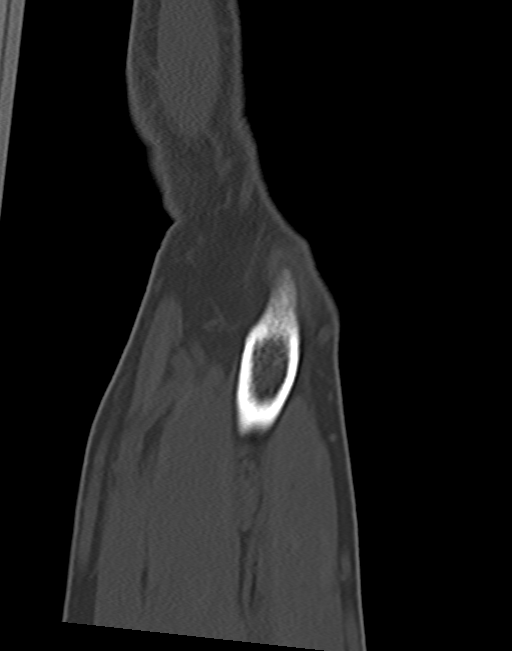
[im 75/90  bone]
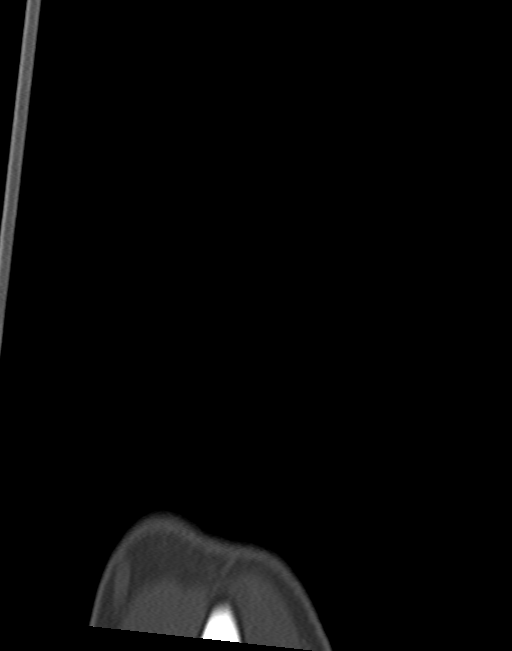

[Series 8: coronal soft · coronal · 0.30mm/px · 3 of 52 slices shown]
[im 11/52  bone]
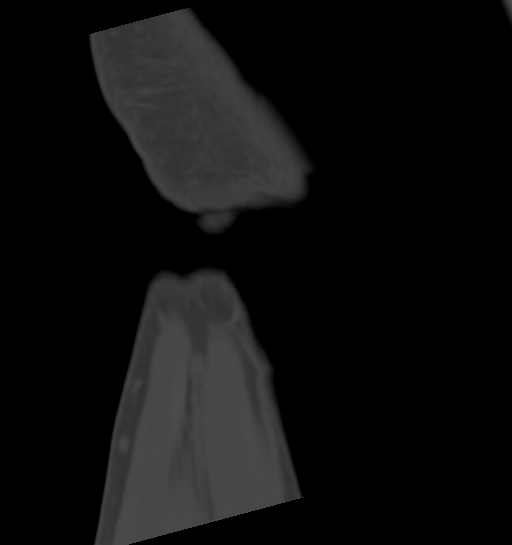
[im 21/52  bone]
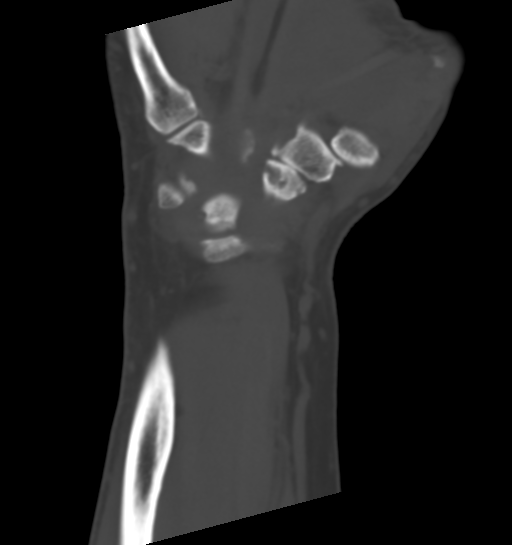
[im 31/52  bone]
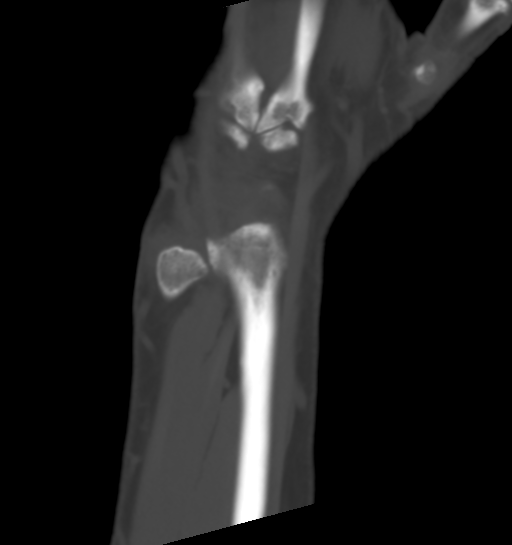

[14 of 35 positions shown; findings below may reference images not displayed]

FINDINGS: Bones/Joint/Cartilage

No fracture or dislocation. Ulnar minus variance. Slight dorsal
subluxation of the distal ulna relative to the radius. No joint
effusion.

Moderate osteoarthritis of the scaphotrapeziotrapezoid joint with
subchondral cystic changes in the distal pole of the scaphoid.
Moderate osteoarthritis of the first CMC joint. Mild osteoarthritis
of the first MCP joint.

Ligaments

Ligaments are suboptimally evaluated by CT.

Muscles and Tendons
Muscles are normal. No muscle atrophy. No intramuscular fluid
collection or hematoma. Flexor and extensor compartment tendons are
grossly intact. Peripheral subluxation of the extensor carpi ulnaris
as can be seen with subsheath injury.

Soft tissue
No fluid collection or hematoma.  No soft tissue mass.
IMPRESSION: 1. No acute osseous injury of the right wrist.
2. Moderate osteoarthritis of the scaphotrapeziotrapezoid joint with
subchondral cystic changes in the distal pole of the scaphoid.
3. Moderate osteoarthritis of the first CMC joint.
4. Peripheral subluxation of the extensor carpi ulnaris as can be
seen with subsheath injury.

## 2023-09-21 ENCOUNTER — Telehealth: Payer: Self-pay

## 2023-09-21 NOTE — Telephone Encounter (Signed)
 Copied from CRM [id number]. Topic: Clinical - Request for Lab/Test Order >> Sep 21, 2023  8:19 AM Robert Walker wrote: Reason for CRM: Pt says Robert Walker is due for fasting labs for his 8/21 appt but due to his sleep schedule with him getting up at 4am, Robert Walker cannot and will not wait until the lab opens to eat. Robert Walker says Robert Walker will have the labs done but will not fast. Please advise   Pt is available via My Chart or phone if needed

## 2023-09-21 NOTE — Telephone Encounter (Signed)
 Unable to leave vm.

## 2023-09-21 NOTE — Telephone Encounter (Signed)
 Left detailed vm

## 2023-09-22 DIAGNOSIS — I10 Essential (primary) hypertension: Secondary | ICD-10-CM | POA: Diagnosis not present

## 2023-09-22 DIAGNOSIS — E785 Hyperlipidemia, unspecified: Secondary | ICD-10-CM | POA: Diagnosis not present

## 2023-09-22 DIAGNOSIS — E119 Type 2 diabetes mellitus without complications: Secondary | ICD-10-CM | POA: Diagnosis not present

## 2023-09-22 DIAGNOSIS — E1169 Type 2 diabetes mellitus with other specified complication: Secondary | ICD-10-CM | POA: Diagnosis not present

## 2023-09-23 LAB — LIPID PANEL
Chol/HDL Ratio: 3 ratio (ref 0.0–5.0)
Cholesterol, Total: 122 mg/dL (ref 100–199)
HDL: 41 mg/dL (ref >39)
LDL Chol Calc (NIH): 47 mg/dL (ref 0–99)
Triglycerides: 215 mg/dL — ABNORMAL HIGH (ref 0–149)
VLDL Cholesterol Cal: 34 mg/dL (ref 5–40)

## 2023-09-23 LAB — BASIC METABOLIC PANEL WITH GFR
BUN/Creatinine Ratio: 14 (ref 10–24)
BUN: 12 mg/dL (ref 8–27)
CO2: 22 mmol/L (ref 20–29)
Calcium: 9.7 mg/dL (ref 8.6–10.2)
Chloride: 97 mmol/L (ref 96–106)
Creatinine, Ser: 0.88 mg/dL (ref 0.76–1.27)
Glucose: 103 mg/dL — ABNORMAL HIGH (ref 70–99)
Potassium: 4.4 mmol/L (ref 3.5–5.2)
Sodium: 134 mmol/L (ref 134–144)
eGFR: 90 mL/min/1.73 (ref >59)

## 2023-09-23 LAB — HEMOGLOBIN A1C
Est. average glucose Bld gHb Est-mCnc: 146 mg/dL
Hgb A1c MFr Bld: 6.7 % — ABNORMAL HIGH (ref 4.8–5.6)

## 2023-09-24 ENCOUNTER — Ambulatory Visit: Payer: PPO | Admitting: Internal Medicine

## 2023-09-24 ENCOUNTER — Encounter: Payer: Self-pay | Admitting: Internal Medicine

## 2023-09-24 ENCOUNTER — Ambulatory Visit (INDEPENDENT_AMBULATORY_CARE_PROVIDER_SITE_OTHER): Admitting: Internal Medicine

## 2023-09-24 ENCOUNTER — Telehealth: Payer: Self-pay | Admitting: Internal Medicine

## 2023-09-24 VITALS — BP 138/74 | HR 93 | Ht 68.0 in | Wt 177.6 lb

## 2023-09-24 DIAGNOSIS — E1169 Type 2 diabetes mellitus with other specified complication: Secondary | ICD-10-CM

## 2023-09-24 DIAGNOSIS — E782 Mixed hyperlipidemia: Secondary | ICD-10-CM

## 2023-09-24 DIAGNOSIS — L729 Follicular cyst of the skin and subcutaneous tissue, unspecified: Secondary | ICD-10-CM | POA: Diagnosis not present

## 2023-09-24 DIAGNOSIS — Z0001 Encounter for general adult medical examination with abnormal findings: Secondary | ICD-10-CM

## 2023-09-24 DIAGNOSIS — Z23 Encounter for immunization: Secondary | ICD-10-CM | POA: Diagnosis not present

## 2023-09-24 DIAGNOSIS — I10 Essential (primary) hypertension: Secondary | ICD-10-CM

## 2023-09-24 DIAGNOSIS — Z91030 Bee allergy status: Secondary | ICD-10-CM | POA: Diagnosis not present

## 2023-09-24 MED ORDER — EPINEPHRINE 0.3 MG/0.3ML IJ SOAJ: 0.3000 mg | INTRAMUSCULAR | 1 refills | Status: AC | PRN

## 2023-09-24 NOTE — Assessment & Plan Note (Signed)
 Had generalized rash and swelling due to bee sting Refilled EpiPen 

## 2023-09-24 NOTE — Assessment & Plan Note (Signed)
 Lab Results  Component Value Date   HGBA1C 6.7 (H) 09/22/2023   Overall well-controlled Associated with HTN and HLD Diet controlled, prefers to avoid medicine Advised to follow diabetic diet On statin and ACEi F/u CMP and lipid panel Diabetic eye exam: Advised to follow up with Ophthalmology for diabetic eye exam

## 2023-09-24 NOTE — Assessment & Plan Note (Signed)
 BP Readings from Last 1 Encounters:  09/24/23 138/74   Well-controlled with lisinopril -HCTZ 20-12.5 mg QD Counseled for compliance with the medications Advised DASH diet and moderate exercise/walking, at least 150 mins/week

## 2023-09-24 NOTE — Telephone Encounter (Signed)
 LVM for pt in regard to envelope he dropped off at the office today to update insurance with [facility]- unable to do it here, customer service number is [phone] they should be able to help him. Have envelope at my desk if he would like to pick it up.

## 2023-09-24 NOTE — Assessment & Plan Note (Addendum)
 On simvastatin  40 mg QD Lipid profile shows improvement in LDL and TG

## 2023-09-24 NOTE — Assessment & Plan Note (Signed)
Physical exam as documented. Fasting blood tests today. PCV20 vaccine today.

## 2023-09-24 NOTE — Patient Instructions (Signed)
Please continue to take medications as prescribed.  Please continue to follow low carb diet and perform moderate exercise/walking at least 150 mins/week.  Please get blood tests done before the next visit.

## 2023-09-24 NOTE — Progress Notes (Signed)
 Established Patient Office Visit  Subjective:  Patient ID: Robert Walker, male    DOB: 05/14/1950  Age: 73 y.o. MRN: [id number]  CC:  Chief Complaint  Patient presents with   Insect Bite    Had a bee sting on right hand 2 days ago needs a refill on epi pen.    Annual Exam    HPI Robert Walker is a 73 y.o. male with past medical history of HTN, type II DM, HLD and MDD who presents for annual physical.  HTN: BP is well-controlled. Takes lisinopril -HCTZ 20-12.5 mg QD regularly. Patient denies headache, dizziness, chest pain, dyspnea or palpitations.  Type II DM: Has been diet controlled.  His HbA1c has improved to 6.7 now, last HbA1c was 7.2 in 02/25.  He has been trying to cut down sweets.  Denies chronic fatigue, polyuria or polyphagia.  He takes simvastatin  40 mg QD for HLD.  MDD: He takes citalopram  20 mg QD.  He has bee sting allergy, and keeps Epipen , which he had to use 2 days ago. He requests a refill of Epipen .  Past Medical History:  Diagnosis Date   Alcoholism (HCC)    quit in 2022   Depression    Diabetes (HCC)    HLD (hyperlipidemia)    Hypertension     Past Surgical History:  Procedure Laterality Date   CATARACT EXTRACTION W/PHACO Left 11/28/2022   Procedure: CATARACT EXTRACTION PHACO AND INTRAOCULAR LENS PLACEMENT (IOC);  Surgeon: Juli Blunt, MD;  Location: [facility];  Service: Ophthalmology;  Laterality: Left;  CDE: 3.76   CATARACT EXTRACTION W/PHACO Right 12/12/2022   Procedure: CATARACT EXTRACTION PHACO AND INTRAOCULAR LENS PLACEMENT (IOC);  Surgeon: Juli Blunt, MD;  Location: [facility];  Service: Ophthalmology;  Laterality: Right;  CDE 4.84   HERNIA REPAIR     TONSILLECTOMY     age 9    History reviewed. No pertinent family history.  Social History   Socioeconomic History   Marital status: Married    Spouse name: Not on file   Number of children: Not on file   Years of education: Not on file   Highest education level: Not on file   Occupational History   Not on file  Tobacco Use   Smoking status: Former    Current packs/day: 0.00    Types: Cigarettes    Start date: 25    Quit date: 28    Years since quitting: 40.6    Passive exposure: Past   Smokeless tobacco: Never  Vaping Use   Vaping status: Never Used  Substance and Sexual Activity   Alcohol use: Not Currently   Drug use: Never   Sexual activity: Not on file  Other Topics Concern   Not on file  Social History Narrative   Not on file   Social Drivers of Health   Financial Resource Strain: Not on file  Food Insecurity: Not on file  Transportation Needs: Not on file  Physical Activity: Not on file  Stress: Not on file  Social Connections: Not on file  Intimate Partner Violence: Not on file    Outpatient Medications Prior to Visit  Medication Sig Dispense Refill   aspirin EC 81 MG tablet Take 81 mg by mouth daily. Swallow whole.     citalopram  (CELEXA ) 20 MG tablet Take 1 tablet (20 mg total) by mouth daily. 90 tablet 3   Flaxseed, Linseed, (FLAXSEED OIL) 1000 MG CAPS Take by mouth.     fluorometholone (FML) 0.1 %  ophthalmic suspension Place 1 drop into both eyes 2 (two) times daily.     Lactobacillus-Inulin (CULTURELLE ADULT ULT BALANCE) CAPS Take by mouth in the morning and at bedtime.     latanoprost (XALATAN) 0.005 % ophthalmic solution Place 1 drop into both eyes at bedtime.     lisinopril -hydrochlorothiazide  (ZESTORETIC ) 20-12.5 MG tablet Take 1 tablet by mouth daily. 90 tablet 3   Misc Natural Products (PROSTATE SUPPORT PO) Take by mouth. 3 per day     Multiple Vitamins-Minerals (PRESERVISION AREDS 2 PO) Take by mouth. 2 per day     simvastatin  (ZOCOR ) 40 MG tablet TAKE ONE TABLET BY MOUTH AT BEDTIME 90 tablet 0   Turmeric (QC TUMERIC COMPLEX PO) Take by mouth. One daily     Wheat Dextrin (BENEFIBER PO) Take by mouth. Once daily     XIIDRA 5 % SOLN Apply 1 drop to eye 2 (two) times daily.     No facility-administered medications  prior to visit.    Allergies  Allergen Reactions   Bee Venom Anaphylaxis    ROS Review of Systems  Constitutional:  Negative for chills and fever.  HENT:  Negative for congestion and sore throat.   Eyes:  Negative for pain and discharge.  Respiratory:  Negative for cough and shortness of breath.   Cardiovascular:  Negative for chest pain and palpitations.  Gastrointestinal:  Negative for diarrhea, nausea and vomiting.  Endocrine: Negative for polydipsia and polyuria.  Genitourinary:  Negative for dysuria and hematuria.  Musculoskeletal:  Negative for neck pain and neck stiffness.  Skin:  Negative for rash.       Laceration of right little finger  Neurological:  Negative for dizziness, weakness, numbness and headaches.  Psychiatric/Behavioral:  Negative for agitation and behavioral problems.       Objective:    Physical Exam Vitals reviewed.  Constitutional:      General: He is not in acute distress.    Appearance: He is not diaphoretic.  HENT:     Head: Normocephalic and atraumatic.     Nose: Nose normal.     Mouth/Throat:     Mouth: Mucous membranes are moist.  Eyes:     General: No scleral icterus.    Extraocular Movements: Extraocular movements intact.  Cardiovascular:     Rate and Rhythm: Normal rate and regular rhythm.     Heart sounds: Normal heart sounds. No murmur heard. Pulmonary:     Breath sounds: Normal breath sounds. No wheezing or rales.  Abdominal:     Palpations: Abdomen is soft.     Tenderness: There is no abdominal tenderness.  Musculoskeletal:     Cervical back: Neck supple. No tenderness.     Right lower leg: No edema.     Left lower leg: No edema.  Skin:    General: Skin is warm.     Findings: No rash.     Comments: Laceration over right little finger tip - improved now Cyst near right heel - about 1 cm in diameter  Neurological:     General: No focal deficit present.     Mental Status: He is alert and oriented to person, place, and  time.     Cranial Nerves: No cranial nerve deficit.     Sensory: No sensory deficit.     Motor: No weakness.  Psychiatric:        Mood and Affect: Mood normal.        Behavior: Behavior normal.  BP 138/74 (BP Location: Left Arm)   Pulse 93   Ht 5' 8 (1.727 m)   Wt 177 lb 9.6 oz (80.6 kg)   SpO2 94%   BMI 27.00 kg/m  Wt Readings from Last 3 Encounters:  09/24/23 177 lb 9.6 oz (80.6 kg)  07/17/23 180 lb (81.6 kg)  07/10/23 180 lb (81.6 kg)    Lab Results  Component Value Date   TSH 1.290 03/27/2023   Lab Results  Component Value Date   WBC 6.7 03/27/2023   HGB 15.0 03/27/2023   HCT 43.4 03/27/2023   MCV 94 03/27/2023   PLT 389 03/27/2023   Lab Results  Component Value Date   NA 134 09/22/2023   K 4.4 09/22/2023   CO2 22 09/22/2023   GLUCOSE 103 (H) 09/22/2023   BUN 12 09/22/2023   CREATININE 0.88 09/22/2023   BILITOT 0.3 03/27/2023   ALKPHOS 63 03/27/2023   AST 32 03/27/2023   ALT 24 03/27/2023   PROT 7.5 03/27/2023   ALBUMIN 5.1 (H) 03/27/2023   CALCIUM 9.7 09/22/2023   EGFR 90 09/22/2023   Lab Results  Component Value Date   CHOL 122 09/22/2023   Lab Results  Component Value Date   HDL 41 09/22/2023   Lab Results  Component Value Date   LDLCALC 47 09/22/2023   Lab Results  Component Value Date   TRIG 215 (H) 09/22/2023   Lab Results  Component Value Date   CHOLHDL 3.0 09/22/2023   Lab Results  Component Value Date   HGBA1C 6.7 (H) 09/22/2023      Assessment & Plan:   Problem List Items Addressed This Visit       Cardiovascular and Mediastinum   Essential hypertension   BP Readings from Last 1 Encounters:  09/24/23 138/74   Well-controlled with lisinopril -HCTZ 20-12.5 mg QD Counseled for compliance with the medications Advised DASH diet and moderate exercise/walking, at least 150 mins/week      Relevant Medications   EPINEPHrine  (EPIPEN  2-PAK) 0.3 mg/0.3 mL IJ SOAJ injection   Other Relevant Orders   CMP14+EGFR   CBC  with Differential/Platelet     Endocrine   Type 2 diabetes mellitus with other specified complication (HCC)   Lab Results  Component Value Date   HGBA1C 6.7 (H) 09/22/2023   Overall well-controlled Associated with HTN and HLD Diet controlled, prefers to avoid medicine Advised to follow diabetic diet On statin and ACEi F/u CMP and lipid panel Diabetic eye exam: Advised to follow up with Ophthalmology for diabetic eye exam      Relevant Orders   CMP14+EGFR   Hemoglobin A1c     Musculoskeletal and Integument   Benign skin cyst   On right foot, near achilles tendon area, about 1 cm in diameter        Other   HLD (hyperlipidemia)   On simvastatin  40 mg QD Lipid profile shows improvement in LDL and TG      Relevant Medications   EPINEPHrine  (EPIPEN  2-PAK) 0.3 mg/0.3 mL IJ SOAJ injection   Other Relevant Orders   Lipid Profile   Bee sting allergy   Had generalized rash and swelling due to bee sting Refilled EpiPen       Relevant Medications   EPINEPHrine  (EPIPEN  2-PAK) 0.3 mg/0.3 mL IJ SOAJ injection   Encounter for general adult medical examination with abnormal findings - Primary   Physical exam as documented. Fasting blood tests today. PCV20 vaccine today.      Other Visit  Diagnoses       Encounter for immunization       Relevant Orders   Pneumococcal conjugate vaccine 20-valent (Completed)       Meds ordered this encounter  Medications   EPINEPHrine  (EPIPEN  2-PAK) 0.3 mg/0.3 mL IJ SOAJ injection    Sig: Inject 0.3 mg into the muscle as needed for anaphylaxis.    Dispense:  1 each    Refill:  1    Follow-up: Return in about 6 months (around 03/26/2024) for DM and HTN.    Suzzane MARLA Blanch, MD

## 2023-09-24 NOTE — Assessment & Plan Note (Signed)
 On right foot, near achilles tendon area, about 1 cm in diameter

## 2023-11-06 ENCOUNTER — Other Ambulatory Visit: Payer: Self-pay | Admitting: Internal Medicine

## 2023-11-06 DIAGNOSIS — E7800 Pure hypercholesterolemia, unspecified: Secondary | ICD-10-CM

## 2023-12-24 DIAGNOSIS — H538 Other visual disturbances: Secondary | ICD-10-CM | POA: Diagnosis not present

## 2023-12-29 ENCOUNTER — Ambulatory Visit (INDEPENDENT_AMBULATORY_CARE_PROVIDER_SITE_OTHER): Payer: Self-pay

## 2023-12-29 DIAGNOSIS — Z23 Encounter for immunization: Secondary | ICD-10-CM

## 2023-12-29 NOTE — Progress Notes (Signed)
 Patient is in office today for a nurse visit for flu shot. Patient Injection was given in the  Left deltoid. Patient tolerated injection well.

## 2023-12-30 DIAGNOSIS — H26491 Other secondary cataract, right eye: Secondary | ICD-10-CM | POA: Diagnosis not present

## 2024-02-08 ENCOUNTER — Other Ambulatory Visit: Payer: Self-pay | Admitting: Internal Medicine

## 2024-02-08 DIAGNOSIS — E7800 Pure hypercholesterolemia, unspecified: Secondary | ICD-10-CM

## 2024-02-08 MED ORDER — SIMVASTATIN 40 MG PO TABS: 40.0000 mg | ORAL_TABLET | Freq: Every day | ORAL | 0 refills | Status: AC

## 2024-02-08 NOTE — Telephone Encounter (Signed)
 Copied from CRM [id number]. Topic: Clinical - Medication Refill >> Feb 08, 2024  8:26 AM Tobias L wrote: Medication: simvastatin  (ZOCOR ) 40 MG tablet Requesting refill sent to pharmacy below.   Has the patient contacted their pharmacy? No Patient had to switch pharmacies due to insurance.   This is the patient's preferred pharmacy:  [facility] [phone] - [place], [place] - [address] AT [facility] [address] [place] [address] Phone: [phone] Fax: [phone]  Is this the correct pharmacy for this prescription? Yes\  Has the prescription been filled recently? No  Is the patient out of the medication? No  Has the patient been seen for an appointment in the last year OR does the patient have an upcoming appointment? Yes  Can we respond through MyChart? Yes  Agent: Please be advised that Rx refills may take up to 3 business days. We ask that you follow-up with your pharmacy.

## 2024-02-10 ENCOUNTER — Encounter: Payer: Self-pay | Admitting: Internal Medicine

## 2024-03-28 ENCOUNTER — Ambulatory Visit: Admitting: Internal Medicine
